# Patient Record
Sex: Male | Born: 1960
Health system: Southern US, Community
[De-identification: ages and names within clinical notes are randomized; demographics above are authoritative.]

## PROBLEM LIST (undated history)

## (undated) DIAGNOSIS — K219 Gastro-esophageal reflux disease without esophagitis: Secondary | ICD-10-CM

## (undated) DIAGNOSIS — M7542 Impingement syndrome of left shoulder: Secondary | ICD-10-CM

## (undated) DIAGNOSIS — M7522 Bicipital tendinitis, left shoulder: Secondary | ICD-10-CM

## (undated) DIAGNOSIS — Z98811 Dental restoration status: Secondary | ICD-10-CM

## (undated) DIAGNOSIS — M24112 Other articular cartilage disorders, left shoulder: Secondary | ICD-10-CM

---

## 1985-06-02 HISTORY — PX: WISDOM TOOTH EXTRACTION: SHX21

## 1998-01-20 ENCOUNTER — Emergency Department (HOSPITAL_COMMUNITY): Admission: EM | Admit: 1998-01-20 | Discharge: 1998-01-20 | Payer: Self-pay | Admitting: Emergency Medicine

## 2004-07-01 ENCOUNTER — Ambulatory Visit: Payer: Self-pay | Admitting: Family Medicine

## 2004-07-18 ENCOUNTER — Ambulatory Visit: Payer: Self-pay | Admitting: Family Medicine

## 2006-03-09 ENCOUNTER — Ambulatory Visit: Payer: Self-pay | Admitting: Family Medicine

## 2006-06-01 ENCOUNTER — Ambulatory Visit: Payer: Self-pay | Admitting: Family Medicine

## 2008-02-28 ENCOUNTER — Ambulatory Visit: Payer: Self-pay | Admitting: Family Medicine

## 2008-05-12 ENCOUNTER — Encounter: Admission: RE | Admit: 2008-05-12 | Discharge: 2008-05-12 | Payer: Self-pay | Admitting: Family Medicine

## 2008-05-12 ENCOUNTER — Ambulatory Visit: Payer: Self-pay | Admitting: Family Medicine

## 2008-05-12 ENCOUNTER — Encounter (INDEPENDENT_AMBULATORY_CARE_PROVIDER_SITE_OTHER): Payer: Self-pay | Admitting: Internal Medicine

## 2008-05-12 DIAGNOSIS — R209 Unspecified disturbances of skin sensation: Secondary | ICD-10-CM | POA: Insufficient documentation

## 2008-05-12 LAB — CONVERTED CEMR LAB
Bilirubin Urine: NEGATIVE
Blood in Urine, dipstick: NEGATIVE
Glucose, Urine, Semiquant: NEGATIVE
Protein, U semiquant: NEGATIVE
Specific Gravity, Urine: 1.015
Urobilinogen, UA: 0.2
WBC Urine, dipstick: NEGATIVE

## 2008-05-16 ENCOUNTER — Encounter (INDEPENDENT_AMBULATORY_CARE_PROVIDER_SITE_OTHER): Payer: Self-pay | Admitting: Internal Medicine

## 2008-05-16 LAB — CONVERTED CEMR LAB
ALT: 21 units/L (ref 0–53)
Basophils Absolute: 0 10*3/uL (ref 0.0–0.1)
Basophils Relative: 0 % (ref 0.0–3.0)
Bilirubin, Direct: 0.1 mg/dL (ref 0.0–0.3)
CO2: 29 meq/L (ref 19–32)
Calcium: 9.4 mg/dL (ref 8.4–10.5)
Cholesterol: 209 mg/dL (ref 0–200)
Creatinine, Ser: 1 mg/dL (ref 0.4–1.5)
Direct LDL: 136.5 mg/dL
Eosinophils Absolute: 0.3 10*3/uL (ref 0.0–0.7)
GFR calc Af Amer: 103 mL/min
GFR calc non Af Amer: 85 mL/min
HCT: 41.8 % (ref 39.0–52.0)
Hemoglobin: 14.9 g/dL (ref 13.0–17.0)
MCHC: 35.7 g/dL (ref 30.0–36.0)
MCV: 92.8 fL (ref 78.0–100.0)
Monocytes Absolute: 0.5 10*3/uL (ref 0.1–1.0)
Neutro Abs: 2.5 10*3/uL (ref 1.4–7.7)
PSA: 0.73 ng/mL (ref 0.10–4.00)
RBC: 4.5 M/uL (ref 4.22–5.81)
RDW: 12 % (ref 11.5–14.6)
Sodium: 139 meq/L (ref 135–145)
TSH: 1.29 microintl units/mL (ref 0.35–5.50)
Total Bilirubin: 1.1 mg/dL (ref 0.3–1.2)
VLDL: 11 mg/dL (ref 0–40)

## 2008-05-23 ENCOUNTER — Encounter (INDEPENDENT_AMBULATORY_CARE_PROVIDER_SITE_OTHER): Payer: Self-pay | Admitting: Internal Medicine

## 2009-08-13 ENCOUNTER — Ambulatory Visit: Payer: Self-pay | Admitting: Family Medicine

## 2010-05-06 ENCOUNTER — Ambulatory Visit: Payer: Self-pay | Admitting: Family Medicine

## 2010-05-06 DIAGNOSIS — L909 Atrophic disorder of skin, unspecified: Secondary | ICD-10-CM | POA: Insufficient documentation

## 2010-05-06 DIAGNOSIS — L919 Hypertrophic disorder of the skin, unspecified: Secondary | ICD-10-CM

## 2010-05-07 LAB — CONVERTED CEMR LAB
CO2: 29 meq/L (ref 19–32)
Chloride: 102 meq/L (ref 96–112)
Creatinine, Ser: 1 mg/dL (ref 0.4–1.5)
Glucose, Bld: 101 mg/dL — ABNORMAL HIGH (ref 70–99)
Total CHOL/HDL Ratio: 4
Triglycerides: 55 mg/dL (ref 0.0–149.0)

## 2010-07-02 NOTE — Assessment & Plan Note (Signed)
Summary: COUGH,CONGESTION/CLE   Vital Signs:  Patient profile:   50 year old male Height:      71 inches Weight:      192.8 pounds BMI:     26.99 Temp:     98.0 degrees F oral Pulse rate:   76 / minute Pulse rhythm:   regular BP sitting:   110 / 70  (left arm) Cuff size:   regular  Vitals Entered By: Benny Lennert CMA Duncan Dull) (August 13, 2009 10:49 AM)  History of Present Illness: Chief complaint cough and congestion for 62 days  50 year old male:  No fever  Has been trying some stuff from [facility], over the counter coughing a lot at night    Acute Visit History:      The patient complains of cough, headache, musculoskeletal symptoms, nasal discharge, and sore throat.  These symptoms began 4 days ago.  He denies chest pain, fever, nausea, rash, and sinus problems.        The cough interferes with his sleep.  There is no history of wheezing, shortness of breath, respiratory retractions, tachypnea, cyanosis, or interference with oral intake associated with his cough.        'Cold' or URI symptoms have been present with the sore throat.  There is no history of dysphagia, drooling, or recent exposure to strep.        Urine output has been normal.  He is tolerating clear liquids.        Allergies (verified): No Known Drug Allergies  Past History:  Past medical, surgical, family and social histories (including risk factors) reviewed, and no changes noted (except as noted below).  Past Surgical History: Reviewed history from 05/12/2008 and no changes required. wisdom teeth--age 44  Family History: Reviewed history from 05/12/2008 and no changes required. Father: died at age 39--seizure Mother: 78--L&W Siblings: 1 br--L&W               5 sis--L&W  DM-  0 MI- 0 CVA- 0 Prostate Cancer- 0 Breast Cancer- Ovarian Cancer- Uterine Cancer- Colon Cancer- 0 Drug/ ETOH Abuse- 0 Depression-   Social History: Reviewed history from 05/12/2008 and no changes required. Marital  Status: Married Children: 3--1 in college, 2 at home Occupation: septic system--owner  Review of Systems       REVIEW OF SYSTEMS GEN: Acute illness details above. CV: No chest pain or SOB GI: No noted N or V Otherwise, pertinent positives and negatives are noted in the HPI.   Physical Exam  Additional Exam:  GEN: WDWN, NAD; alert,appropriate and cooperative throughout exam HEENT: Normocephalic and atraumatic. Throat clear, w/o exudate, no LAD, R TM clear, L TM - good landmarks, No fluid present. rhinnorhea.  Left frontal and maxillary sinuses: NT Right frontal and maxillary sinuses: NT NECK: No ant or post LAD CV: RRR, No M/G/R PULM: no resp distress, no accessory muscles.  No retractions. no w/c/r ABD: S,NT,ND,+BS, No HSM EXTR: no c/c/e PSYCH: full affect, pleasant, conversant    Impression & Recommendations:  Problem # 1:  URI (ICD-465.9) Assessment New prob viral syndrome with pulmonary symptoms. patient highly concerned about bacterial infection. Instructed to keep Zpak for 4-5 more days, if worsening, febrile, OK to fill. If getting better, viral  His updated medication list for this problem includes:    Tessalon 200 Mg Caps (Benzonatate) .Marland Kitchen... Take one capsule by mouth three times a day as needed for cough    Hydrocodone-homatropine 5-1.5 Mg/106ml Syrp (Hydrocodone-homatropine) .Marland KitchenMarland KitchenMarland KitchenMarland Kitchen 1  by mouth at bedtime as needed cough  Problem # 2:  COUGH (ICD-786.2) Assessment: New  Complete Medication List: 1)  Tessalon 200 Mg Caps (Benzonatate) .... Take one capsule by mouth three times a day as needed for cough 2)  Hydrocodone-homatropine 5-1.5 Mg/65ml Syrp (Hydrocodone-homatropine) .Marland Kitchen.. 1 by mouth at bedtime as needed cough 3)  Azithromycin 250 Mg Tabs (Azithromycin) .... 2 by  mouth today and then 1 daily for 4 days Prescriptions: AZITHROMYCIN 250 MG  TABS (AZITHROMYCIN) 2 by  mouth today and then 1 daily for 4 days  #6 x 0   Entered and Authorized by:   Hannah Beat MD    Signed by:   Hannah Beat MD on 08/13/2009   Method used:   Print then Give to Patient   RxID:   [id number] HYDROCODONE-HOMATROPINE 5-1.5 MG/5ML SYRP (HYDROCODONE-HOMATROPINE) 1 by mouth at bedtime as needed cough  #240 mL x 0   Entered and Authorized by:   Hannah Beat MD   Signed by:   Hannah Beat MD on 08/13/2009   Method used:   Print then Give to Patient   RxID:   [id number] TESSALON 200 MG CAPS (BENZONATATE) Take one capsule by mouth three times a day as needed for cough  #40 x 0   Entered and Authorized by:   Hannah Beat MD   Signed by:   Hannah Beat MD on 08/13/2009   Method used:   Print then Give to Patient   RxID:   [id number]   Prior Medications (reviewed today): None Current Allergies (reviewed today): No known allergies

## 2010-07-02 NOTE — Assessment & Plan Note (Signed)
Summary: cpx/establish w/ new dr. Annye Asa   Vital Signs:  Patient profile:   50 year old male Height:      71 inches Weight:      199.75 pounds BMI:     27.96 Temp:     98.5 degrees F oral Pulse rate:   54 / minute Pulse rhythm:   regular BP sitting:   110 / 84  (left arm) Cuff size:   large  Vitals Entered By: Selena Batten Dance CMA (AAMA) (May 06, 2010 8:25 AM) CC: CPx  Vision Screening:Left eye w/o correction: 20 / 30 Right Eye w/o correction: 20 / 25 Both eyes w/o correction:  20/ 25  Color vision testing: normal      Vision Entered By: Selena Batten Dance CMA (AAMA) (May 06, 2010 9:27 AM)  Hearing Screen 25db HL: Left  500 hz: 25db 1000 hz: 25db 2000 hz: 25db 4000 hz: 25db Right  500 hz: 25db 1000 hz: 25db 2000 hz: 25db 4000 hz: 25db    History of Present Illness: CC: CPE  requests flu shot.  No concerns today.  would like skin spot on L abd checked - irritating and catches on clothes  not due for prostate/colon screen.  Strong stream, no nocturia, no blood in stool or urine, no changes to BMs. tetanus - 2009  + chewing tobacco 1 can/day.  will quit at age 43yo.  Preventive Screening-Counseling & Management  Alcohol-Tobacco     Smoking Status: current     Cans of tobacco/week: 1  Current Medications (verified): 1)  None  Allergies (verified): No Known Drug Allergies  Past History:  Past Medical History: none  Family History: Father: died at age 62--seizure Mother: 78--L&W Siblings: 1 br--L&W               5 sis--L&W  No DM, CAD/MI, CVA, no CA  Social History: No smoking, occ EtOH, no rec drugs + chewing tobacco Caffeine: 1 cup coffee, 1 drink a day Marital Status: Married Children: 3--1 in college, 2 at home, 3 dogs, 1 cat Occupation: septic system--owner  Review of Systems  The patient denies anorexia, fever, weight loss, weight gain, vision loss, decreased hearing, hoarseness, chest pain, syncope, dyspnea on exertion, peripheral  edema, prolonged cough, headaches, hemoptysis, abdominal pain, melena, hematochezia, severe indigestion/heartburn, hematuria, incontinence, difficulty walking, depression, and testicular masses.    Physical Exam  General:  Well-developed,well-nourished,in no acute distress; alert,appropriate and cooperative throughout examination Head:  Normocephalic and atraumatic without obvious abnormalities. No apparent alopecia or balding. Eyes:  PERRLA, EOMI, no injection Ears:  TMs clear bilaterally Nose:  nares clear Mouth:  no pharyngeal erythema Neck:  no LAD Lungs:  CTAB, no c/w Heart:  normal S1, S2, no m/r/g Abdomen:  soft, NTND, NABS, no HSM, no masses appreciated Msk:  No deformity or scoliosis noted of thoracic or lumbar spine.   Pulses:  2+ rad pulses Extremities:  no c/c/e Neurologic:  CN grossly intact, station and gait intact Skin:  L skin tag, irritated at base, over inferior rib cage Psych:  Cognition and judgment appear intact. Alert and cooperative with normal attention span and concentration. No apparent delusions, illusions, hallucinations   Impression & Recommendations:  Problem # 1:  OTH GENERAL MEDICAL EXAMINATION ADMIN PURPOSES (ICD-V70.3) Reviewed preventive care protocols, scheduled due services, and updated immunizations.  rec stop dipping.  flu shot todya.  UTD tetanus.  not due for colon, prostate.  Orders: TLB-Lipid Panel (80061-LIPID) TLB-BMP (Basic Metabolic Panel-BMET) (80048-METABOL)  Problem # 2:  SPECIAL SCREENING MALIGNANT NEOPLASM OF PROSTATE (ICD-V76.44) 2009 PSA WNL, will wait til age 55 to readress  Problem # 3:  ACROCHORDON (ICD-701.9) irritated, removed x 1 Orders: Removal of Skin Tags up to 15 Lesions (11200)  Other Orders: Flu Vaccine 74yrs + (64403) Admin 1st Vaccine (47425)  Patient Instructions: 1)  Return in 1 year (at age 37yo) for next CPE. 2)  Skin tag removed today.   3)  Flu shot today. 4)  Blood work today. 5)  Good to see you  today, call clinic with questions.   Orders Added: 1)  TLB-Lipid Panel [80061-LIPID] 2)  TLB-BMP (Basic Metabolic Panel-BMET) [80048-METABOL] 3)  Est. Patient 40-64 years [99396] 4)  Removal of Skin Tags up to 15 Lesions [11200] 5)  Flu Vaccine 21yrs + [90658] 6)  Admin 1st Vaccine [95638]   Immunizations Administered:  Influenza Vaccine # 1:    Vaccine Type: Fluvax 3+    Site: right deltoid    Mfr: GlaxoSmithKline    Dose: 0.5 ml    Route: IM    Given by: Selena Batten Dance CMA (AAMA)    Exp. Date: 11/30/2010    Lot #: [id number]    VIS given: 12/25/09 version given May 06, 2010.  Flu Vaccine Consent Questions:    Do you have a history of severe allergic reactions to this vaccine? no    Any prior history of allergic reactions to egg and/or gelatin? no    Do you have a sensitivity to the preservative Thimersol? no    Do you have a past history of Guillan-Barre Syndrome? no    Do you currently have an acute febrile illness? no    Have you ever had a severe reaction to latex? no    Vaccine information given and explained to patient? yes   Immunizations Administered:  Influenza Vaccine # 1:    Vaccine Type: Fluvax 3+    Site: right deltoid    Mfr: GlaxoSmithKline    Dose: 0.5 ml    Route: IM    Given by: Selena Batten Dance CMA (AAMA)    Exp. Date: 11/30/2010    Lot #: [id number]    VIS given: 12/25/09 version given May 06, 2010.  Current Allergies (reviewed today): No known allergies     Procedure Note Last Tetanus: Tdap (05/12/2008)  Skin Tag Removal: The patient complains of redness, irritation, and changing lesion but denies foreign body and fever. Indication: inflamed lesion Consent signed: yes  Procedure # 1: skin tag removal    Region: anterior    Location: abdomen-upper-left    # lesions removed: 1    Instrument used: scissors    Anesthesia: 1% lidocaine w/epinephrine  Cleaned and prepped with: alcohol Wound dressing: neosporin and  bandaid   Prevention & Chronic Care Immunizations   Influenza vaccine: Fluvax 3+  (05/06/2010)    Tetanus booster: 05/12/2008: Tdap   Tetanus booster due: 05/12/2018    Pneumococcal vaccine: Not documented  Other Screening   Smoking status: current  (05/06/2010)  Lipids   Total Cholesterol: 209  (05/12/2008)   LDL: DEL  (05/12/2008)   LDL Direct: 136.5  (05/12/2008)   HDL: 57.0  (05/12/2008)   Triglycerides: 53  (05/12/2008)

## 2010-07-02 NOTE — Miscellaneous (Signed)
Summary: Procedure Concent  Procedure Concent   Imported By: Lester [place] 05/09/2010 12:15:52  _____________________________________________________________________  External Attachment:    Type:   Image     Comment:   External Document

## 2010-07-04 NOTE — Letter (Signed)
Summary: CDL Form/NCDMV  CDL Form/NCDMV   Imported By: Lanelle Bal 05/28/2010 15:34:00  _____________________________________________________________________  External Attachment:    Type:   Image     Comment:   External Document

## 2011-02-17 ENCOUNTER — Encounter: Payer: Self-pay | Admitting: Family Medicine

## 2011-02-18 ENCOUNTER — Encounter: Payer: Self-pay | Admitting: Family Medicine

## 2011-02-18 ENCOUNTER — Ambulatory Visit (INDEPENDENT_AMBULATORY_CARE_PROVIDER_SITE_OTHER): Payer: Self-pay | Admitting: Family Medicine

## 2011-02-18 VITALS — BP 122/78 | HR 64 | Temp 97.7°F | Ht 70.0 in | Wt 205.5 lb

## 2011-02-18 DIAGNOSIS — R42 Dizziness and giddiness: Secondary | ICD-10-CM

## 2011-02-18 DIAGNOSIS — Z23 Encounter for immunization: Secondary | ICD-10-CM

## 2011-02-18 LAB — BASIC METABOLIC PANEL
BUN: 23 mg/dL (ref 6–23)
Chloride: 103 mEq/L (ref 96–112)
Creatinine, Ser: 1 mg/dL (ref 0.4–1.5)
GFR: 83.12 mL/min (ref >60.00)
Glucose, Bld: 96 mg/dL (ref 70–99)
Potassium: 4.4 mEq/L (ref 3.5–5.1)

## 2011-02-18 LAB — CBC WITH DIFFERENTIAL/PLATELET
Basophils Relative: 0.2 % (ref 0.0–3.0)
Eosinophils Absolute: 0.2 10*3/uL (ref 0.0–0.7)
Eosinophils Relative: 4.6 % (ref 0.0–5.0)
HCT: 46 % (ref 39.0–52.0)
Hemoglobin: 15.7 g/dL (ref 13.0–17.0)
Lymphs Abs: 1.3 10*3/uL (ref 0.7–4.0)
MCHC: 34.2 g/dL (ref 30.0–36.0)
MCV: 95.5 fl (ref 78.0–100.0)
Monocytes Absolute: 0.5 10*3/uL (ref 0.1–1.0)
Neutro Abs: 3 10*3/uL (ref 1.4–7.7)
Neutrophils Relative %: 59.7 % (ref 43.0–77.0)
RBC: 4.82 Mil/uL (ref 4.22–5.81)
WBC: 5 10*3/uL (ref 4.5–10.5)

## 2011-02-18 LAB — HEPATIC FUNCTION PANEL
ALT: 30 U/L (ref 0–53)
AST: 24 U/L (ref 0–37)

## 2011-02-18 NOTE — Patient Instructions (Signed)
See printed notes.

## 2011-02-18 NOTE — Progress Notes (Signed)
  Subjective:    Patient ID: Christopher Ballard, male    DOB: 1960/11/01, 50 y.o.   MRN: [id number]  HPI CC: HA, dizziness  1 1/2 wk h/o lightheadedness in AM, when awakens.  Mainly orthostatic sxs.  Also with pressure in temples (not HA), subjective temperature as well.  Dizziness described as "unstable on feet".  Denies RN, significant congestion, sneezing, cough, abd pain, n/v/d, muffled hearing, tinnitus, chest pain or tightness, SOB or vertigo sxs.  No new rash, no palpitation symptoms.  Recently had CPE, cleared for 2 years.  He hasn't tried anything so far either.  Family and pt very healthy o/w.  Also has had several what he thinks are chigger bites, for last 2 wks mainly left leg.  Using OTC anti chigger medicine that dries bites out.  Review of Systems Per HPI    Objective:   Physical Exam  Nursing note and vitals reviewed. Constitutional: He is oriented to person, place, and time. He appears well-developed and well-nourished. No distress.  HENT:  Head: Normocephalic and atraumatic.  Right Ear: Hearing and external ear normal.  Left Ear: Hearing and external ear normal.  Nose: Mucosal edema present. No rhinorrhea. Right sinus exhibits no maxillary sinus tenderness and no frontal sinus tenderness. Left sinus exhibits no maxillary sinus tenderness and no frontal sinus tenderness.  Mouth/Throat: Uvula is midline, oropharynx is clear and moist and mucous membranes are normal. No oropharyngeal exudate, posterior oropharyngeal edema, posterior oropharyngeal erythema or tonsillar abscesses.       Cerumen bilaterally, but TMs clear. Turbinates swollen bilaterally  Eyes: Conjunctivae and EOM are normal. Pupils are equal, round, and reactive to light. No scleral icterus.  Neck: Normal range of motion. Neck supple. Carotid bruit is not present.  Cardiovascular: Normal rate, regular rhythm, normal heart sounds and intact distal pulses.   No murmur heard. Pulmonary/Chest: Effort normal  and breath sounds normal. No respiratory distress. He has no wheezes. He has no rales.  Musculoskeletal: Normal range of motion.  Lymphadenopathy:    He has no cervical adenopathy.  Neurological: He is alert and oriented to person, place, and time. He has normal strength. He displays normal reflexes. No cranial nerve deficit or sensory deficit. He exhibits normal muscle tone. He displays a negative Romberg sign. Coordination and gait normal.  Reflex Scores:      Bicep reflexes are 2+ on the right side and 2+ on the left side.      Patellar reflexes are 2+ on the right side and 2+ on the left side.      Achilles reflexes are 2+ on the right side and 2+ on the left side.      Normal FTN, normal HTS, no dysdiadokokinesia, neg dix hallpike.  Skin: Skin is warm and dry.       Papular excoriated lesions left leg from ankle to knee, pruritic.  Psychiatric: He has a normal mood and affect.          Assessment & Plan:

## 2011-02-18 NOTE — Assessment & Plan Note (Signed)
Doubt inner ear pathology. Normal neurologic exam today.  Normal cardiac exam today. Some nasal turbinate swelling - ?sinus congestion causing head pressure and dizziness.  Start nasal saline and INS (provided with veramyst sample). Check blood work for reversible causes if dizziness. If worsening or not improving as expected, return to see me.

## 2012-04-23 ENCOUNTER — Encounter: Payer: BC Managed Care – PPO | Admitting: Family Medicine

## 2012-05-07 ENCOUNTER — Encounter: Payer: BC Managed Care – PPO | Admitting: Family Medicine

## 2012-05-19 ENCOUNTER — Ambulatory Visit (INDEPENDENT_AMBULATORY_CARE_PROVIDER_SITE_OTHER): Payer: BC Managed Care – PPO | Admitting: Family Medicine

## 2012-05-19 ENCOUNTER — Encounter: Payer: Self-pay | Admitting: Family Medicine

## 2012-05-19 ENCOUNTER — Encounter: Payer: Self-pay | Admitting: Internal Medicine

## 2012-05-19 VITALS — BP 130/82 | HR 60 | Temp 98.4°F | Ht 70.0 in | Wt 206.8 lb

## 2012-05-19 DIAGNOSIS — Z1211 Encounter for screening for malignant neoplasm of colon: Secondary | ICD-10-CM

## 2012-05-19 DIAGNOSIS — Z0000 Encounter for general adult medical examination without abnormal findings: Secondary | ICD-10-CM | POA: Insufficient documentation

## 2012-05-19 DIAGNOSIS — Z23 Encounter for immunization: Secondary | ICD-10-CM

## 2012-05-19 DIAGNOSIS — Z0279 Encounter for issue of other medical certificate: Secondary | ICD-10-CM

## 2012-05-19 DIAGNOSIS — N529 Male erectile dysfunction, unspecified: Secondary | ICD-10-CM | POA: Insufficient documentation

## 2012-05-19 DIAGNOSIS — Z0001 Encounter for general adult medical examination with abnormal findings: Secondary | ICD-10-CM | POA: Insufficient documentation

## 2012-05-19 DIAGNOSIS — Z125 Encounter for screening for malignant neoplasm of prostate: Secondary | ICD-10-CM

## 2012-05-19 LAB — BASIC METABOLIC PANEL
BUN: 19 mg/dL (ref 6–23)
Chloride: 104 mEq/L (ref 96–112)
Creatinine, Ser: 0.9 mg/dL (ref 0.4–1.5)
GFR: 94.47 mL/min (ref >60.00)
Potassium: 4.9 mEq/L (ref 3.5–5.1)

## 2012-05-19 LAB — LIPID PANEL
LDL Cholesterol: 118 mg/dL — ABNORMAL HIGH (ref 0–99)
Total CHOL/HDL Ratio: 4
VLDL: 13.2 mg/dL (ref 0.0–40.0)

## 2012-05-19 LAB — POCT URINALYSIS DIPSTICK
Blood, UA: NEGATIVE
Ketones, UA: NEGATIVE
Protein, UA: NEGATIVE
Spec Grav, UA: 1.01
Urobilinogen, UA: 0.2

## 2012-05-19 LAB — PSA: PSA: 1.06 ng/mL (ref 0.10–4.00)

## 2012-05-19 MED ORDER — TADALAFIL 10 MG PO TABS: 10.0000 mg | ORAL_TABLET | ORAL | Status: DC | PRN

## 2012-05-19 MED ORDER — TADALAFIL 20 MG PO TABS: 10.0000 mg | ORAL_TABLET | ORAL | Status: DC | PRN

## 2012-05-19 NOTE — Assessment & Plan Note (Signed)
Framingham risk = 5%.  Denies anginal or chest discomfort sxs.  Trial of cialis.  Discussed side effects to watch for. coupon provided today.

## 2012-05-19 NOTE — Progress Notes (Signed)
  Subjective:    Patient ID: Christopher Ballard, male    DOB: [birth year], 51 y.o.   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  Some ED - trouble maintaining erection.  Has tried viagra in past, interested in cialis  Preventative: Flu shot today Tetanus 2009 Colon cancer screening - discussed, would like colonsocopy Prostate cancer screening - discussed, will screen this year.  Review of Systems  Constitutional: Negative for fever, chills, activity change, appetite change, fatigue and unexpected weight change.  HENT: Negative for hearing loss and neck pain.   Eyes: Negative for visual disturbance.  Respiratory: Negative for cough, chest tightness, shortness of breath and wheezing.   Cardiovascular: Negative for chest pain, palpitations and leg swelling.  Gastrointestinal: Negative for nausea, vomiting, abdominal pain, diarrhea, constipation, blood in stool and abdominal distention.  Genitourinary: Negative for hematuria and difficulty urinating.  Musculoskeletal: Negative for myalgias and arthralgias.  Skin: Negative for rash.  Neurological: Negative for dizziness, seizures, syncope and headaches.  Hematological: Does not bruise/bleed easily.  Psychiatric/Behavioral: Negative for dysphoric mood. The patient is not nervous/anxious.        Objective:   Physical Exam  Nursing note and vitals reviewed. Constitutional: He is oriented to person, place, and time. He appears well-developed and well-nourished. No distress.  HENT:  Head: Normocephalic and atraumatic.  Right Ear: Hearing, tympanic membrane, external ear and ear canal normal.  Left Ear: Hearing, tympanic membrane, external ear and ear canal normal.  Nose: Nose normal.  Mouth/Throat: Oropharynx is clear and moist. No oropharyngeal exudate.       Cerumen in ears bilaterally - discussed dilute H2O2  Eyes: Conjunctivae normal and EOM are normal. Pupils are equal, round, and reactive to light.  No scleral icterus.  Neck: Normal range of motion. Neck supple.  Cardiovascular: Normal rate, regular rhythm, normal heart sounds and intact distal pulses.   No murmur heard. Pulses:      Radial pulses are 2+ on the right side, and 2+ on the left side.  Pulmonary/Chest: Effort normal and breath sounds normal. No respiratory distress. He has no wheezes. He has no rales.  Abdominal: Soft. Bowel sounds are normal. He exhibits no distension and no mass. There is no tenderness. There is no rebound and no guarding.  Genitourinary: Rectum normal and prostate normal. Rectal exam shows no external hemorrhoid, no internal hemorrhoid, no fissure, no mass, no tenderness and anal tone normal. Prostate is not enlarged (20gm) and not tender.  Musculoskeletal: Normal range of motion. He exhibits no edema.  Lymphadenopathy:    He has no cervical adenopathy.  Neurological: He is alert and oriented to person, place, and time.       CN grossly intact, station and gait intact  Skin: Skin is warm and dry. No rash noted.  Psychiatric: He has a normal mood and affect. His behavior is normal. Judgment and thought content normal.       Assessment & Plan:

## 2012-05-19 NOTE — Assessment & Plan Note (Signed)
Preventative protocols reviewed and updated unless pt declined. Discussed healthy diet and lifestyle. 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

## 2012-05-19 NOTE — Patient Instructions (Addendum)
Flu shot today Pass by Marion's office to schedule screening colonoscopy. Blood work today. Trial of cialis 10-20mg  as needed - sent into pharmacy today.

## 2012-05-20 ENCOUNTER — Encounter: Payer: Self-pay | Admitting: *Deleted

## 2012-07-09 ENCOUNTER — Ambulatory Visit (AMBULATORY_SURGERY_CENTER): Payer: BC Managed Care – PPO

## 2012-07-09 ENCOUNTER — Encounter: Payer: Self-pay | Admitting: Internal Medicine

## 2012-07-09 VITALS — Ht 70.0 in | Wt 203.8 lb

## 2012-07-09 DIAGNOSIS — Z1211 Encounter for screening for malignant neoplasm of colon: Secondary | ICD-10-CM

## 2012-07-09 MED ORDER — NA SULFATE-K SULFATE-MG SULF 17.5-3.13-1.6 GM/177ML PO SOLN: 1.0000 | Freq: Once | ORAL | Status: DC

## 2012-07-23 ENCOUNTER — Encounter: Payer: Self-pay | Admitting: Internal Medicine

## 2012-07-23 ENCOUNTER — Ambulatory Visit (AMBULATORY_SURGERY_CENTER): Payer: BC Managed Care – PPO | Admitting: Internal Medicine

## 2012-07-23 VITALS — BP 100/67 | HR 53 | Temp 96.5°F | Resp 15 | Ht 70.0 in | Wt 203.0 lb

## 2012-07-23 DIAGNOSIS — Z1211 Encounter for screening for malignant neoplasm of colon: Secondary | ICD-10-CM

## 2012-07-23 DIAGNOSIS — K648 Other hemorrhoids: Secondary | ICD-10-CM

## 2012-07-23 HISTORY — PX: COLONOSCOPY WITH PROPOFOL: SHX5780

## 2012-07-23 MED ORDER — SODIUM CHLORIDE 0.9 % IV SOLN: 500.0000 mL | INTRAVENOUS | Status: DC

## 2012-07-23 NOTE — Progress Notes (Signed)
Patient did not experience any of the following events: a burn prior to discharge; a fall within the facility; wrong site/side/patient/procedure/implant event; or a hospital transfer or hospital admission upon discharge from the facility. (G8907) Patient did not have preoperative order for IV antibiotic SSI prophylaxis. (G8918)  

## 2012-07-23 NOTE — Op Note (Signed)
[facility] [address] [place], [address]   COLONOSCOPY PROCEDURE REPORT  [name], Ballard  MR#: [id number] BIRTHDATE: 04-20-1961 , 51  yrs. old GENDER: Male ENDOSCOPIST: Iva Boop, MD, [facility] REFERRED XB:JYNWGN Sharen Hones, M.D. PROCEDURE DATE:  07/23/2012 PROCEDURE:   Colonoscopy, screening ASA CLASS:   Class I INDICATIONS:average risk screening. MEDICATIONS: propofol (Diprivan) 200mg  IV, MAC sedation, administered by CRNA, and These medications were titrated to Ballard response per physician's verbal order  DESCRIPTION OF PROCEDURE:   After Christopher risks benefits and alternatives of Christopher procedure were thoroughly explained, informed consent was obtained.  A digital rectal exam revealed no abnormalities of Christopher rectum, A digital rectal exam revealed Christopher prostate was not enlarged, and A digital rectal exam revealed no prostatic nodules.   Christopher LB [id number]  endoscope was introduced through Christopher anus and advanced to Christopher cecum, which was identified by both Christopher appendix and ileocecal valve. No adverse events experienced.   Christopher quality of Christopher prep was Suprep excellent Christopher instrument was then slowly withdrawn as Christopher colon was fully examined.      COLON FINDINGS: Small internal hemorrhoids were found.   Christopher colon mucosa was otherwise normal.   A right colon retroflexion was performed.  Retroflexed views revealed internal hemorrhoids. Christopher time to cecum=2 minutes 36 seconds.  Withdrawal time=7 minutes 58 seconds.  Christopher scope was withdrawn and Christopher procedure completed. COMPLICATIONS: There were no complications.  ENDOSCOPIC IMPRESSION: 1.   Small internal hemorrhoids 2.   Christopher colon mucosa was otherwise normal with excellent prep  RECOMMENDATIONS: Repeat colonoscopy 10 years.   eSigned:  Iva Boop, MD, [facility] 07/23/2012 9:34 AM   cc: Eustaquio Boyden MD and Christopher Ballard

## 2012-07-23 NOTE — Patient Instructions (Addendum)
No polyps seen on exam today. Prep was great! You do have small internal hemorrhoids which are common and not usually a problem.  Next routine colonoscopy in about 10 years (2024).  Thank you for choosing me and [facility].  Iva Boop, MD, [facility]  Discharge instructions given with verbal understanding. Handout on hemorrhoids. Resume previous medications. YOU HAD AN ENDOSCOPIC PROCEDURE TODAY AT THE [facility]: Refer to the procedure report that was given to you for any specific questions about what was found during the examination.  If the procedure report does not answer your questions, please call your gastroenterologist to clarify.  If you requested that your care partner not be given the details of your procedure findings, then the procedure report has been included in a sealed envelope for you to review at your convenience later.  YOU SHOULD EXPECT: Some feelings of bloating in the abdomen. Passage of more gas than usual.  Walking can help get rid of the air that was put into your GI tract during the procedure and reduce the bloating. If you had a lower endoscopy (such as a colonoscopy or flexible sigmoidoscopy) you may notice spotting of blood in your stool or on the toilet paper. If you underwent a bowel prep for your procedure, then you may not have a normal bowel movement for a few days.  DIET: Your first meal following the procedure should be a light meal and then it is ok to progress to your normal diet.  A half-sandwich or bowl of soup is an example of a good first meal.  Heavy or fried foods are harder to digest and may make you feel nauseous or bloated.  Likewise meals heavy in dairy and vegetables can cause extra gas to form and this can also increase the bloating.  Drink plenty of fluids but you should avoid alcoholic beverages for 24 hours.  ACTIVITY: Your care partner should take you home directly after the procedure.  You should plan to take it  easy, moving slowly for the rest of the day.  You can resume normal activity the day after the procedure however you should NOT DRIVE or use heavy machinery for 24 hours (because of the sedation medicines used during the test).    SYMPTOMS TO REPORT IMMEDIATELY: A gastroenterologist can be reached at any hour.  During normal business hours, 8:30 AM to 5:00 PM Monday through Friday, call [phone].  After hours and on weekends, please call the GI answering service at [phone] who will take a message and have the physician on call contact you.   Following lower endoscopy (colonoscopy or flexible sigmoidoscopy):  Excessive amounts of blood in the stool  Significant tenderness or worsening of abdominal pains  Swelling of the abdomen that is new, acute  Fever of 100F or higher  FOLLOW UP: If any biopsies were taken you will be contacted by phone or by letter within the next 1-3 weeks.  Call your gastroenterologist if you have not heard about the biopsies in 3 weeks.  Our staff will call the home number listed on your records the next business day following your procedure to check on you and address any questions or concerns that you may have at that time regarding the information given to you following your procedure. This is a courtesy call and so if there is no answer at the home number and we have not heard from you through the emergency physician on call, we will assume that  you have returned to your regular daily activities without incident.  SIGNATURES/CONFIDENTIALITY: You and/or your care partner have signed paperwork which will be entered into your electronic medical record.  These signatures attest to the fact that that the information above on your After Visit Summary has been reviewed and is understood.  Full responsibility of the confidentiality of this discharge information lies with you and/or your care-partner.

## 2012-07-26 ENCOUNTER — Telehealth: Payer: Self-pay | Admitting: *Deleted

## 2012-07-26 NOTE — Telephone Encounter (Signed)
  Follow up Call-  Call back number 07/23/2012  Post procedure Call Back phone  # [phone]  Permission to leave phone message Yes     Patient questions:  Do you have a fever, pain , or abdominal swelling? no Pain Score  0 *  Have you tolerated food without any problems? yes  Have you been able to return to your normal activities? yes  Do you have any questions about your discharge instructions: Diet   no Medications  no Follow up visit  no  Do you have questions or concerns about your Care? no  Actions: * If pain score is 4 or above: No action needed, pain <4.

## 2012-08-09 ENCOUNTER — Encounter: Payer: Self-pay | Admitting: Family Medicine

## 2014-04-20 ENCOUNTER — Ambulatory Visit (INDEPENDENT_AMBULATORY_CARE_PROVIDER_SITE_OTHER): Payer: BC Managed Care – PPO | Admitting: Family Medicine

## 2014-04-20 ENCOUNTER — Encounter: Payer: Self-pay | Admitting: Family Medicine

## 2014-04-20 VITALS — BP 110/68 | HR 68 | Temp 98.3°F | Wt 212.8 lb

## 2014-04-20 DIAGNOSIS — J069 Acute upper respiratory infection, unspecified: Secondary | ICD-10-CM

## 2014-04-20 DIAGNOSIS — Z23 Encounter for immunization: Secondary | ICD-10-CM

## 2014-04-20 MED ORDER — AMOXICILLIN-POT CLAVULANATE 875-125 MG PO TABS: 1.0000 | ORAL_TABLET | Freq: Two times a day (BID) | ORAL | Status: AC

## 2014-04-20 MED ORDER — HYDROCOD POLST-CHLORPHEN POLST 10-8 MG/5ML PO LQCR
5.0000 mL | Freq: Every evening | ORAL | Status: DC | PRN
Start: 2014-04-20 — End: 2014-10-13

## 2014-04-20 NOTE — Patient Instructions (Signed)
Great to meet you. Take Augmentin as directed- 1 tablet twice daily x 10 days.  Cough suppressant as needed at bedtime.  Please make an appointment with Dr. Reece AgarG on your way out.

## 2014-04-20 NOTE — Progress Notes (Signed)
Pre visit review using our clinic review tool, if applicable. No additional management support is needed unless otherwise documented below in the visit note. 

## 2014-04-20 NOTE — Assessment & Plan Note (Signed)
New- Given duration and progression of symptoms, will treat for bacterial sinusitis with Augmentin twice daily x 10 days. Advised OTC steroid nasal spray, continued antihistamine. Rx given for tussionex to use prn at night- discussed sedation precautions.  Symptomatic therapy suggested: push fluids, rest and return office visit prn if symptoms persist or worsen.

## 2014-04-20 NOTE — Progress Notes (Signed)
SUBJECTIVE:  Christopher Ballard is a 53 y.o. male pt of Dr. Reece AgarG, new to me, who complains of coryza, congestion, sneezing, post nasal drip, productive cough and green nasal discharge for 9 days.  Feels symptoms are worsening despite taking "everything over the counter."  He denies a history of shortness of breath, sweats, vomiting and weakness and denies a history of asthma. Patient denies smoke cigarettes.   No current outpatient prescriptions on file prior to visit.   No current facility-administered medications on file prior to visit.    No Known Allergies  No past medical history on file.  Past Surgical History  Procedure Laterality Date  . Wisdom tooth extraction  1987  . Colonoscopy  07/2012    int hemorrhoids Leone Payor([facility])    Family History  Problem Relation Age of Onset  . Seizures Father   . Healthy Mother   . Healthy Brother   . Healthy Sister   . Colon cancer Neg Hx     History   Social History  . Marital Status: Married    Spouse Name: N/A    Number of Children: 3  . Years of Education: N/A   Occupational History  . Self-employed    Social History Main Topics  . Smoking status: Never Smoker   . Smokeless tobacco: Current User    Types: Chew  . Alcohol Use: Yes     Comment: Occasional  . Drug Use: No  . Sexual Activity: Not on file   Other Topics Concern  . Not on file   Social History Narrative   Caffeine: 1 cup coffee/day; 1 drink/day   Lives alone   Married   3 grown children   3 dogs   1 cat   Occupation: self employed (septic system)   Activity: T25   Diet: good water, fruits/vegetables daily   The PMH, PSH, Social History, Family History, Medications, and allergies [facility], and have been updated if relevant.  OBJECTIVE: BP 110/68 mmHg  Pulse 68  Temp(Src) 98.3 F (36.8 C) (Oral)  Wt 212 lb 12 oz (96.503 kg)  SpO2 95%  He appears well, vital signs are as noted. Ears normal.  Throat and pharynx normal.  Neck supple. No  adenopathy in the neck. Nose is congested. Sinuses +/- tender. The chest is clear, without wheezes or rales.

## 2014-04-21 ENCOUNTER — Telehealth: Payer: Self-pay | Admitting: Family Medicine

## 2014-04-21 NOTE — Telephone Encounter (Signed)
emmi emialed °

## 2014-10-01 DIAGNOSIS — M7542 Impingement syndrome of left shoulder: Secondary | ICD-10-CM

## 2014-10-01 DIAGNOSIS — M24112 Other articular cartilage disorders, left shoulder: Secondary | ICD-10-CM

## 2014-10-01 DIAGNOSIS — M7522 Bicipital tendinitis, left shoulder: Secondary | ICD-10-CM

## 2014-10-01 HISTORY — DX: Other articular cartilage disorders, left shoulder: M24.112

## 2014-10-01 HISTORY — DX: Bicipital tendinitis, left shoulder: M75.22

## 2014-10-01 HISTORY — DX: Impingement syndrome of left shoulder: M75.42

## 2014-10-13 ENCOUNTER — Encounter (HOSPITAL_BASED_OUTPATIENT_CLINIC_OR_DEPARTMENT_OTHER): Payer: Self-pay | Admitting: *Deleted

## 2014-10-16 ENCOUNTER — Other Ambulatory Visit: Payer: Self-pay | Admitting: Physician Assistant

## 2014-10-18 NOTE — H&P (Signed)
[facility] [address]   [address] [place], [place] [id number] [phone] A Division of [facility]  Loreta Aveaniel F. Murphy, M.D.   Robert A. Thurston HoleWainer, M.D.   Burnell BlanksW. Dan Caffrey, M.D.   Eulas PostJoshua P. Landau, M.D.   Lunette StandsAnna Voytek, M.D Jewel Baizeimothy D. Eulah PontMurphy, M.D.  Buford DresserWesley R. Ibazebo, M.D.  Estell HarpinJames S. Kramer, M.D.    Melina Fiddlerebecca S. Bassett, M.D. Janalee DaneBrittney Kelly, PA- C  Mary L. Dub MikesStanbery, PA-C  Kirstin A. Shepperson, PA-C  Josh Maryvillehadwell, PA-C  [place]andon Parry, [place]A-C   RE: Christopher ParcelOverbey, Theadore                                [phone]      DOB: 05/31/1961 PROGRESS NOTE: 09-12-14 Christopher BameRonnie is a new patient to the office.  Very active healthy 54 year-old male.  He owns his own [employer].  Presents for evaluation and treatment recommendation for bilateral shoulder pain, left greater than right.  Both insidious onset.  Both worse when he tries to go overhead or to bring his arm behind his back.  Worse with activity, better with rest.  Left has been symptomatic going on two years.  Getting worse.  Starting to wake him up at night.  The right for the last 3-4 months.  History of an AC separation, right shoulder, 30 years ago.  Closed treatment.  He has done well with that.  A little bit of neck stiffness, but no distal neurovascular symptoms.  No intervention, scans or workup of either shoulder or his neck.   Remaining history and general exam is outlined and included in the chart.  EXAMINATION: Specifically, this is a healthy appearing 54 year-old.  Height: 5?10.  Weight: 200 pounds.  He has fairly good cervical motion, just a little stiff, but really no pain.  He is neurovascularly intact without radicular signs.  Both shoulders have just about full motion, lacking a little internal rotation.  Markedly positive impingement, left greater than right.  Positive palms down abduction, left greater than right.  No apprehension or instability, both sides.  No atrophy.  Biceps  intact.  A little prominence AC joint on the right, more than on the left.  Pain with cross chest adduction about equal right and left.    X-RAYS: Two view x-rays of the cervical spine shows degenerative disc disease and narrowing at C5-6 and lesser extent 6-7.  Nothing acute.  X-rays of both shoulders show a Type II acromion.  Degenerative changes AC joint.  Reasonable subacromial space.  There is ossification at the CC ligament interval on the right that looks chronic.  No significant glenohumeral arthritis.    IMPRESSION: 1. Although he has degenerative disc disease and narrowing cervical spine C5-6 and C6-7, I don't think this is playing a current part of his symptoms and I don't think his shoulder issues are radicular.  2. Bilateral impingement, left greater than right.  AC separation healed on the right, remote.  We are going to do a diagnostic/therapeutic subacromial injection of both shoulders.  This is to help sort out and be sure that the symptoms are from his shoulder and not his neck.  Jobe exercise program outlined.  I want to hear from him after injection.  I am hopeful that one injection and exercises may settle down the right, but I doubt that is going to be the case on the left based on the  fact that it has been going on for two years.  If we at least get transient improvement with Marcaine the next step would be an MRI and then decompression of the left shoulder.  I went through all of this with him and he understands.  I am going to wait to hear from him.    Continued   [facility] [address]   [address] [place], [place] [id number] [phone] A Division of [facility]  Loreta Aveaniel F. Murphy, M.D.   Robert A. Thurston HoleWainer, M.D.   Burnell BlanksW. Dan Caffrey, M.D.   Eulas PostJoshua P. Landau, M.D.   Lunette StandsAnna Voytek, M.D Jewel Baizeimothy D. Eulah PontMurphy, M.D.  Buford DresserWesley R. Ibazebo, M.D.  Estell HarpinJames S. Kramer, M.D.    Melina Fiddlerebecca S. Bassett, M.D. Janalee DaneBrittney Kelly, PA- C  Mary L.  Dub MikesStanbery, PA-C  Kirstin A. Shepperson, PA-C  Josh Attu Stationhadwell, PA-C  [place]andon Parry, [place]A-C   RE: Christopher ParcelOverbey, Berthel                                [phone]      DOB: 08/05/1960 PROGRESS NOTE: 09-12-14 PROCEDURE NOTE: The patient's clinical condition is marked by substantial pain and/or significant functional disability.  Other conservative therapy has not provided relief, is contraindicated, or not appropriate.  There is a reasonable likelihood that injection will significantly improve the patient's pain and/or functional disability. After appropriate consent and under sterile technique subacromial injection of both shoulders with Depo-Medrol/Marcaine.  Tolerated this well.  Good relief initially with Marcaine in place.    Loreta Aveaniel F. Murphy, M.D.   Electronically verified by Loreta Aveaniel F. Murphy, M.D. DFM:jjh D 09-13-14 T 09-14-14   [facility] [address]   [address] [place], [place] [phone] A Division of [facility]  Loreta Aveaniel F. Murphy, M.D.   Robert A. Thurston HoleWainer, M.D.   Burnell BlanksW. Dan Caffrey, M.D.   Eulas PostJoshua P. Landau, M.D.   Lunette StandsAnna Voytek, M.D Jewel Baizeimothy D. Eulah PontMurphy, M.D.  Buford DresserWesley R. Ibazebo, M.D.  Estell HarpinJames S. Kramer, M.D.    Melina Fiddlerebecca S. Bassett, M.D. Janalee DaneBrittney Kelly, PA- C  Mary L. Dub MikesStanbery, PA-C  Kirstin A. Shepperson, PA-C  Josh Prince Frederickhadwell, PA-C  [place]andon Parry, [place]C   RE: Christopher ParcelOverbey, Larnell                                [phone]      DOB: 12/02/1960 PROGRESS NOTE: 10-10-14 Christopher BameRonnie comes in to review his MRI of his left shoulder.  MRI from Oct 09, 2014 reveals a partial thickness articular surface tear of the subscapularis with subluxation of the long head of the biceps tendon into the subscapularis.  His left shoulder has been bothering him for quite sometime and it has gotten to the point where it is affecting his activities of daily living.  At this point we have suggested a left shoulder arthroscopic decompression and rotator cuff repair and  probable open biceps tenodesis.  Tonny agrees.  We have filled out paperwork to proceed.  Risks, benefits and possible complications reviewed.  Rehab and recovery time discussed. All questions answered.  We will see Ronie at the time of operative intervention.    Loreta Aveaniel F. Murphy, M.D.   Dictated by: Tessa LernerLindsey Stanbery, PA-C Electronically verified by Loreta Aveaniel F. Murphy, M.D. DFM(LS):jjh D 10-10-14 T 10-11-14

## 2014-10-19 ENCOUNTER — Ambulatory Visit (HOSPITAL_BASED_OUTPATIENT_CLINIC_OR_DEPARTMENT_OTHER): Payer: BLUE CROSS/BLUE SHIELD | Admitting: Anesthesiology

## 2014-10-19 ENCOUNTER — Encounter (HOSPITAL_BASED_OUTPATIENT_CLINIC_OR_DEPARTMENT_OTHER)
Admission: RE | Disposition: A | Discharge status: Home / Self Care | Payer: Self-pay | Source: Ambulatory Visit | Attending: Orthopedic Surgery

## 2014-10-19 ENCOUNTER — Other Ambulatory Visit: Payer: Self-pay | Admitting: Physician Assistant

## 2014-10-19 ENCOUNTER — Ambulatory Visit (HOSPITAL_BASED_OUTPATIENT_CLINIC_OR_DEPARTMENT_OTHER)
Admission: RE | Admit: 2014-10-19 | Discharge: 2014-10-19 | Disposition: A | Discharge status: Home / Self Care | Payer: BLUE CROSS/BLUE SHIELD | Source: Ambulatory Visit | Attending: Orthopedic Surgery | Admitting: Orthopedic Surgery

## 2014-10-19 ENCOUNTER — Encounter (HOSPITAL_BASED_OUTPATIENT_CLINIC_OR_DEPARTMENT_OTHER): Payer: Self-pay | Admitting: *Deleted

## 2014-10-19 DIAGNOSIS — M24012 Loose body in left shoulder: Secondary | ICD-10-CM | POA: Diagnosis not present

## 2014-10-19 DIAGNOSIS — M5032 Other cervical disc degeneration, mid-cervical region: Secondary | ICD-10-CM | POA: Insufficient documentation

## 2014-10-19 DIAGNOSIS — X58XXXA Exposure to other specified factors, initial encounter: Secondary | ICD-10-CM | POA: Diagnosis not present

## 2014-10-19 DIAGNOSIS — M7542 Impingement syndrome of left shoulder: Secondary | ICD-10-CM | POA: Insufficient documentation

## 2014-10-19 DIAGNOSIS — S46812A Strain of other muscles, fascia and tendons at shoulder and upper arm level, left arm, initial encounter: Secondary | ICD-10-CM | POA: Insufficient documentation

## 2014-10-19 DIAGNOSIS — M19012 Primary osteoarthritis, left shoulder: Secondary | ICD-10-CM | POA: Insufficient documentation

## 2014-10-19 DIAGNOSIS — Y929 Unspecified place or not applicable: Secondary | ICD-10-CM | POA: Insufficient documentation

## 2014-10-19 HISTORY — DX: Bicipital tendinitis, left shoulder: M75.22

## 2014-10-19 HISTORY — DX: Gastro-esophageal reflux disease without esophagitis: K21.9

## 2014-10-19 HISTORY — DX: Dental restoration status: Z98.811

## 2014-10-19 HISTORY — PX: BICEPT TENODESIS: SHX5116

## 2014-10-19 HISTORY — DX: Other articular cartilage disorders, left shoulder: M24.112

## 2014-10-19 HISTORY — DX: Impingement syndrome of left shoulder: M75.42

## 2014-10-19 LAB — POCT HEMOGLOBIN-HEMACUE: Hemoglobin: 15.5 g/dL (ref 13.0–17.0)

## 2014-10-19 SURGERY — TENODESIS, BICEPS
Anesthesia: General | Site: Shoulder | Laterality: Left

## 2014-10-19 MED ORDER — CEFAZOLIN SODIUM-DEXTROSE 2-3 GM-% IV SOLR
INTRAVENOUS | Status: AC
  Filled 2014-10-19: qty 50

## 2014-10-19 MED ORDER — BUPIVACAINE HCL (PF) 0.25 % IJ SOLN
INTRAMUSCULAR | Status: AC
  Filled 2014-10-19: qty 120

## 2014-10-19 MED ORDER — FENTANYL CITRATE (PF) 100 MCG/2ML IJ SOLN
INTRAMUSCULAR | Status: AC
  Filled 2014-10-19: qty 2

## 2014-10-19 MED ORDER — BUPIVACAINE-EPINEPHRINE (PF) 0.5% -1:200000 IJ SOLN
INTRAMUSCULAR | Status: DC | PRN
  Administered 2014-10-19: 25 mL via PERINEURAL

## 2014-10-19 MED ORDER — OXYCODONE HCL 5 MG PO TABS: 5.0000 mg | ORAL_TABLET | Freq: Once | ORAL | Status: DC | PRN

## 2014-10-19 MED ORDER — PROPOFOL 10 MG/ML IV BOLUS
INTRAVENOUS | Status: DC | PRN
  Administered 2014-10-19: 200 mg via INTRAVENOUS

## 2014-10-19 MED ORDER — MIDAZOLAM HCL 2 MG/2ML IJ SOLN
INTRAMUSCULAR | Status: AC
  Filled 2014-10-19: qty 2

## 2014-10-19 MED ORDER — ONDANSETRON HCL 4 MG/2ML IJ SOLN
INTRAMUSCULAR | Status: DC | PRN
  Administered 2014-10-19: 4 mg via INTRAVENOUS

## 2014-10-19 MED ORDER — BUPIVACAINE HCL (PF) 0.5 % IJ SOLN
INTRAMUSCULAR | Status: AC
  Filled 2014-10-19: qty 120

## 2014-10-19 MED ORDER — OXYCODONE HCL 5 MG/5ML PO SOLN: 5.0000 mg | Freq: Once | ORAL | Status: DC | PRN

## 2014-10-19 MED ORDER — SUCCINYLCHOLINE CHLORIDE 20 MG/ML IJ SOLN
INTRAMUSCULAR | Status: DC | PRN
  Administered 2014-10-19: 100 mg via INTRAVENOUS

## 2014-10-19 MED ORDER — SODIUM CHLORIDE 0.9 % IR SOLN
Status: DC | PRN
Start: 2014-10-19 — End: 2014-10-19
  Administered 2014-10-19: 6000 mL

## 2014-10-19 MED ORDER — MEPERIDINE HCL 25 MG/ML IJ SOLN: 6.2500 mg | INTRAMUSCULAR | Status: DC | PRN

## 2014-10-19 MED ORDER — HYDROMORPHONE HCL 1 MG/ML IJ SOLN: 0.2500 mg | INTRAMUSCULAR | Status: DC | PRN

## 2014-10-19 MED ORDER — GLYCOPYRROLATE 0.2 MG/ML IJ SOLN: 0.2000 mg | Freq: Once | INTRAMUSCULAR | Status: DC | PRN

## 2014-10-19 MED ORDER — OXYCODONE-ACETAMINOPHEN 5-325 MG PO TABS: 1.0000 | ORAL_TABLET | ORAL | Status: DC | PRN

## 2014-10-19 MED ORDER — DEXAMETHASONE SODIUM PHOSPHATE 4 MG/ML IJ SOLN
INTRAMUSCULAR | Status: DC | PRN
  Administered 2014-10-19: 10 mg via INTRAVENOUS

## 2014-10-19 MED ORDER — METHYLPREDNISOLONE ACETATE 80 MG/ML IJ SUSP
INTRAMUSCULAR | Status: AC
  Filled 2014-10-19: qty 4

## 2014-10-19 MED ORDER — FENTANYL CITRATE (PF) 100 MCG/2ML IJ SOLN
50.0000 ug | INTRAMUSCULAR | Status: DC | PRN
  Administered 2014-10-19: 100 ug via INTRAVENOUS

## 2014-10-19 MED ORDER — LACTATED RINGERS IV SOLN
INTRAVENOUS | Status: DC
  Administered 2014-10-19: 10:00:00 via INTRAVENOUS

## 2014-10-19 MED ORDER — LACTATED RINGERS IV SOLN: INTRAVENOUS | Status: DC

## 2014-10-19 MED ORDER — LIDOCAINE HCL (CARDIAC) 20 MG/ML IV SOLN
INTRAVENOUS | Status: DC | PRN
  Administered 2014-10-19: 80 mg via INTRAVENOUS

## 2014-10-19 MED ORDER — CEFAZOLIN SODIUM-DEXTROSE 2-3 GM-% IV SOLR
2.0000 g | INTRAVENOUS | Status: AC
  Administered 2014-10-19: 2 g via INTRAVENOUS

## 2014-10-19 MED ORDER — METHOCARBAMOL 500 MG PO TABS: 500.0000 mg | ORAL_TABLET | Freq: Four times a day (QID) | ORAL | Status: DC

## 2014-10-19 MED ORDER — ONDANSETRON HCL 4 MG PO TABS: 4.0000 mg | ORAL_TABLET | Freq: Three times a day (TID) | ORAL | Status: DC | PRN

## 2014-10-19 MED ORDER — MIDAZOLAM HCL 2 MG/2ML IJ SOLN
1.0000 mg | INTRAMUSCULAR | Status: DC | PRN
  Administered 2014-10-19: 2 mg via INTRAVENOUS

## 2014-10-19 MED ORDER — BUPIVACAINE-EPINEPHRINE (PF) 0.5% -1:200000 IJ SOLN
INTRAMUSCULAR | Status: AC
  Filled 2014-10-19: qty 120

## 2014-10-19 MED ORDER — CHLORHEXIDINE GLUCONATE 4 % EX LIQD: 60.0000 mL | Freq: Once | CUTANEOUS | Status: DC

## 2014-10-19 SURGICAL SUPPLY — 68 items
BENZOIN TINCTURE PRP APPL 2/3 (GAUZE/BANDAGES/DRESSINGS) IMPLANT
BIT DRILL 7/64X5 DISP (BIT) ×2 IMPLANT
BLADE CUTTER GATOR 3.5 (BLADE) ×2 IMPLANT
BLADE CUTTER MENIS 5.5 (BLADE) IMPLANT
BLADE GREAT WHITE 4.2 (BLADE) ×2 IMPLANT
BLADE SURG 15 STRL LF DISP TIS (BLADE) ×1 IMPLANT
BLADE SURG 15 STRL SS (BLADE) ×1
BUR OVAL 6.0 (BURR) ×2 IMPLANT
CANNULA DRY DOC 8X75 (CANNULA) IMPLANT
CANNULA TWIST IN 8.25X7CM (CANNULA) IMPLANT
DECANTER SPIKE VIAL GLASS SM (MISCELLANEOUS) IMPLANT
DRAPE STERI 35X30 U-POUCH (DRAPES) ×2 IMPLANT
DRAPE U-SHAPE 47X51 STRL (DRAPES) ×2 IMPLANT
DRAPE U-SHAPE 76X120 STRL (DRAPES) ×4 IMPLANT
DRSG PAD ABDOMINAL 8X10 ST (GAUZE/BANDAGES/DRESSINGS) ×2 IMPLANT
DURAPREP 26ML APPLICATOR (WOUND CARE) ×2 IMPLANT
ELECT MENISCUS 165MM 90D (ELECTRODE) ×2 IMPLANT
ELECT REM PT RETURN 9FT ADLT (ELECTROSURGICAL) ×2
ELECTRODE REM PT RTRN 9FT ADLT (ELECTROSURGICAL) ×1 IMPLANT
GAUZE SPONGE 4X4 12PLY STRL (GAUZE/BANDAGES/DRESSINGS) ×4 IMPLANT
GAUZE XEROFORM 1X8 LF (GAUZE/BANDAGES/DRESSINGS) ×2 IMPLANT
GLOVE BIOGEL PI IND STRL 7.0 (GLOVE) ×1 IMPLANT
GLOVE BIOGEL PI INDICATOR 7.0 (GLOVE) ×1
GLOVE ECLIPSE 7.0 STRL STRAW (GLOVE) ×2 IMPLANT
GLOVE ORTHO TXT STRL SZ7.5 (GLOVE) IMPLANT
GLOVE SURG ORTHO 8.0 STRL STRW (GLOVE) ×4 IMPLANT
GOWN STRL REUS W/ TWL LRG LVL3 (GOWN DISPOSABLE) ×2 IMPLANT
GOWN STRL REUS W/ TWL XL LVL3 (GOWN DISPOSABLE) ×1 IMPLANT
GOWN STRL REUS W/TWL LRG LVL3 (GOWN DISPOSABLE) ×2
GOWN STRL REUS W/TWL XL LVL3 (GOWN DISPOSABLE) ×1
IV NS IRRIG 3000ML ARTHROMATIC (IV SOLUTION) ×8 IMPLANT
MANIFOLD NEPTUNE II (INSTRUMENTS) ×2 IMPLANT
NDL SUT 6 .5 CRC .975X.05 MAYO (NEEDLE) IMPLANT
NEEDLE MAYO TAPER (NEEDLE)
NEEDLE SCORPION MULTI FIRE (NEEDLE) IMPLANT
NS IRRIG 1000ML POUR BTL (IV SOLUTION) IMPLANT
PACK ARTHROSCOPY DSU (CUSTOM PROCEDURE TRAY) ×2 IMPLANT
PACK BASIN DAY SURGERY FS (CUSTOM PROCEDURE TRAY) ×2 IMPLANT
PASSER SUT SWANSON 36MM LOOP (INSTRUMENTS) ×4 IMPLANT
PENCIL BUTTON HOLSTER BLD 10FT (ELECTRODE) ×2 IMPLANT
SET ARTHROSCOPY TUBING (MISCELLANEOUS) ×1
SET ARTHROSCOPY TUBING LN (MISCELLANEOUS) ×1 IMPLANT
SLEEVE SCD COMPRESS KNEE MED (MISCELLANEOUS) ×2 IMPLANT
SLING ARM IMMOBILIZER LRG (SOFTGOODS) IMPLANT
SLING ARM IMMOBILIZER MED (SOFTGOODS) IMPLANT
SLING ARM LRG ADULT FOAM STRAP (SOFTGOODS) ×2 IMPLANT
SLING ARM MED ADULT FOAM STRAP (SOFTGOODS) IMPLANT
SLING ARM XL FOAM STRAP (SOFTGOODS) IMPLANT
SPONGE LAP 4X18 X RAY DECT (DISPOSABLE) ×2 IMPLANT
STRIP CLOSURE SKIN 1/2X4 (GAUZE/BANDAGES/DRESSINGS) ×2 IMPLANT
SUCTION FRAZIER TIP 10 FR DISP (SUCTIONS) ×2 IMPLANT
SUT ETHIBOND 2 OS 4 DA (SUTURE) IMPLANT
SUT ETHILON 2 0 FS 18 (SUTURE) IMPLANT
SUT ETHILON 3 0 PS 1 (SUTURE) ×2 IMPLANT
SUT FIBERWIRE #2 38 T-5 BLUE (SUTURE) ×2
SUT RETRIEVER MED (INSTRUMENTS) IMPLANT
SUT TIGER TAPE 7 IN WHITE (SUTURE) IMPLANT
SUT VIC AB 0 CT1 27 (SUTURE)
SUT VIC AB 0 CT1 27XBRD ANBCTR (SUTURE) IMPLANT
SUT VIC AB 2-0 SH 27 (SUTURE) ×1
SUT VIC AB 2-0 SH 27XBRD (SUTURE) ×1 IMPLANT
SUT VIC AB 3-0 FS2 27 (SUTURE) IMPLANT
SUTURE FIBERWR #2 38 T-5 BLUE (SUTURE) ×1 IMPLANT
TAPE FIBER 2MM 7IN #2 BLUE (SUTURE) IMPLANT
TOWEL OR 17X24 6PK STRL BLUE (TOWEL DISPOSABLE) ×2 IMPLANT
TOWEL OR NON WOVEN STRL DISP B (DISPOSABLE) ×2 IMPLANT
WATER STERILE IRR 1000ML POUR (IV SOLUTION) ×2 IMPLANT
YANKAUER SUCT BULB TIP NO VENT (SUCTIONS) ×2 IMPLANT

## 2014-10-19 NOTE — Discharge Instructions (Signed)
Shouder arthroscopy, partial rotator cuff tear debridement subacromial decompression Care After Instructions Refer to this sheet in the next few weeks. These discharge instructions provide you with general information on caring for yourself after you leave the hospital. Your caregiver may also give you specific instructions. Your treatment has been planned according to the most current medical practices available, but unavoidable complications sometimes occur. If you have any problems or questions after discharge, please call your caregiver. HOME INSTRUCTIONS You may resume a normal diet and activities as directed. Take showers instead of baths until informed otherwise.  Do not remove bandages until follow up appointment Only take over-the-counter or prescription medicines for pain, discomfort, or fever as directed by your caregiver.  Wear your sling at all times for the next 4 weeks Eat a well-balanced diet.  Avoid lifting or driving until you are instructed otherwise.  Make an appointment to see your caregiver for stitches (suture) or staple removal one week after surgery.  SEEK MEDICAL CARE IF: You have swelling of your calf or leg.  You develop shortness of breath or chest pain.  You have redness, swelling, or increasing pain in the wound.  There is pus or any unusual drainage coming from the surgical site.  You notice a bad smell coming from the surgical site or dressing.  The surgical site breaks open after sutures or staples have been removed.  There is persistent bleeding from the suture or staple line.  You are getting worse or are not improving.  You have any other questions or concerns.  SEEK IMMEDIATE MEDICAL CARE IF:  You have a fever greater than 101 You develop a rash.  You have difficulty breathing.  You develop any reaction or side effects to medicines given.  Your knee motion is decreasing rather than improving.  MAKE SURE YOU:  Understand these instructions.  Will  watch your condition.  Will get help right away if you are not doing well or get worse.     Post Anesthesia Home Care Instructions  Activity: Get plenty of rest for the remainder of the day. A responsible adult should stay with you for 24 hours following the procedure.  For the next 24 hours, DO NOT: -Drive a car -Advertising copywriterperate machinery -Drink alcoholic beverages -Take any medication unless instructed by your physician -Make any legal decisions or sign important papers.  Meals: Start with liquid foods such as gelatin or soup. Progress to regular foods as tolerated. Avoid greasy, spicy, heavy foods. If nausea and/or vomiting occur, drink only clear liquids until the nausea and/or vomiting subsides. Call your physician if vomiting continues.  Special Instructions/Symptoms: Your throat may feel dry or sore from the anesthesia or the breathing tube placed in your throat during surgery. If this causes discomfort, gargle with warm salt water. The discomfort should disappear within 24 hours.  If you had a scopolamine patch placed behind your ear for the management of post- operative nausea and/or vomiting:  1. The medication in the patch is effective for 72 hours, after which it should be removed.  Wrap patch in a tissue and discard in the trash. Wash hands thoroughly with soap and water. 2. You may remove the patch earlier than 72 hours if you experience unpleasant side effects which may include dry mouth, dizziness or visual disturbances. 3. Avoid touching the patch. Wash your hands with soap and water after contact with the patch.   Regional Anesthesia Blocks  1. Numbness or the inability to move the "blocked" extremity  may last from 3-48 hours after placement. The length of time depends on the medication injected and your individual response to the medication. If the numbness is not going away after 48 hours, call your surgeon.  2. The extremity that is blocked will need to be protected until  the numbness is gone and the  Strength has returned. Because you cannot feel it, you will need to take extra care to avoid injury. Because it may be weak, you may have difficulty moving it or using it. You may not know what position it is in without looking at it while the block is in effect.  3. For blocks in the legs and feet, returning to weight bearing and walking needs to be done carefully. You will need to wait until the numbness is entirely gone and the strength has returned. You should be able to move your leg and foot normally before you try and bear weight or walk. You will need someone to be with you when you first try to ensure you do not fall and possibly risk injury.  4. Bruising and tenderness at the needle site are common side effects and will resolve in a few days.  5. Persistent numbness or new problems with movement should be communicated to the surgeon or the [facility] [phone])/ [facility] [place] [phone]).

## 2014-10-19 NOTE — Interval H&P Note (Signed)
History and Physical Interval Note:  10/19/2014 7:32 AM  Christopher Ballard  has presented today for surgery, with the diagnosis of OTHER ARTICULAR CARTILAGE DISORDER, LEFT SHOUDLER IMPINGEMENT SYNDROME OF SYNDROME OF LEFT SHOULDER STRAIN OF MUSCLES AND TENDONS OF THE ROTATOR CUFF OF LEFT SHOULDER INITIAL ENCOUNTER BICIPITAL   The various methods of treatment have been discussed with the patient and family. After consideration of risks, benefits and other options for treatment, the patient has consented to  Procedure(s): LEFT SHOULDER ARTHROSCOPY DEBRIDEMENT ACROMIOPLASTY,  DISTAL CLAVICAL EXCISION,  ARTHROSCOPIC BICEPS TENODESIS VERSES OPEN BICEPS TENODESIS (Left) as a surgical intervention .  The patient's history has been reviewed, patient examined, no change in status, stable for surgery.  I have reviewed the patient's chart and labs.  Questions were answered to the patient's satisfaction.     [name]

## 2014-10-19 NOTE — Transfer of Care (Signed)
Immediate Anesthesia Transfer of Care Note  Patient: Christopher Ballard  Procedure(s) Performed: Procedure(s): BICEPS TENODESIS (Left) SHOULDER ARTHROSCOPY WITH SUBACROMIAL DECOMPRESSION AND DISTAL CLAVICLE EXCISION (Left)  Patient Location: PACU  Anesthesia Type:General  Level of Consciousness: awake, alert  and oriented  Airway & Oxygen Therapy: Patient Spontanous Breathing and Patient connected to face mask oxygen  Post-op Assessment: Report given to RN and Post -op Vital signs reviewed and stable  Post vital signs: Reviewed and stable  Last Vitals:  Filed Vitals:   10/19/14 1007  BP:   Pulse: 55  Temp:   Resp: 11    Complications: No apparent anesthesia complications

## 2014-10-19 NOTE — Anesthesia Procedure Notes (Addendum)
Anesthesia Regional Block:  Interscalene brachial plexus block  Pre-Anesthetic Checklist: ,, timeout performed, Correct Patient, Correct Site, Correct Laterality, Correct Procedure, Correct Position, site marked, Risks and benefits discussed,  Surgical consent,  Pre-op evaluation,  At surgeon's request and post-op pain management  Laterality: Left and Upper  Prep: chloraprep       Needles:  Injection technique: Single-shot  Needle Type: Echogenic Needle     Needle Length: 5cm 5 cm Needle Gauge: 21 and 21 G    Additional Needles:  Procedures: ultrasound guided (picture in chart) Interscalene brachial plexus block Narrative:  Start time: 10/19/2014 9:58 AM End time: 10/19/2014 10:07 AM Injection made incrementally with aspirations every 5 mL.  Performed by: Personally  Anesthesiologist: CREWS, DAVID   Procedure Name: Intubation Performed by: York GricePEARSON, Geri Hepler W Pre-anesthesia Checklist: Patient identified, Timeout performed, Emergency Drugs available, Suction available and Patient being monitored Patient Re-evaluated:Patient Re-evaluated prior to inductionOxygen Delivery Method: Circle system utilized Preoxygenation: Pre-oxygenation with 100% oxygen Intubation Type: IV induction Ventilation: Mask ventilation without difficulty Laryngoscope Size: Miller and 2 Grade View: Grade I Tube type: Oral Tube size: 7.0 mm Number of attempts: 1 Airway Equipment and Method: Stylet Placement Confirmation: ETT inserted through vocal cords under direct vision,  breath sounds checked- equal and bilateral and positive ETCO2 Secured at: 22 cm Tube secured with: Tape Dental Injury: Teeth and Oropharynx as per pre-operative assessment

## 2014-10-19 NOTE — Anesthesia Preprocedure Evaluation (Signed)
Anesthesia Evaluation  Patient identified by MRN, date of birth, ID band Patient awake    Reviewed: Allergy & Precautions, NPO status , Unable to perform ROS - Chart review only  Airway Mallampati: I  TM Distance: >3 FB Neck ROM: Full    Dental  (+) Teeth Intact, Dental Advisory Given   Pulmonary  breath sounds clear to auscultation        Cardiovascular Rhythm:Regular Rate:Normal     Neuro/Psych    GI/Hepatic   Endo/Other    Renal/GU      Musculoskeletal   Abdominal   Peds  Hematology   Anesthesia Other Findings   Reproductive/Obstetrics                             Anesthesia Physical Anesthesia Plan  ASA: I  Anesthesia Plan: General   Post-op Pain Management: MAC Combined w/ Regional for Post-op pain   Induction: Intravenous  Airway Management Planned: Oral ETT  Additional Equipment:   Intra-op Plan:   Post-operative Plan: Extubation in OR  Informed Consent: I have reviewed the patients History and Physical, chart, labs and discussed the procedure including the risks, benefits and alternatives for the proposed anesthesia with the patient or authorized representative who has indicated his/her understanding and acceptance.   Dental advisory given  Plan Discussed with: CRNA, Anesthesiologist and Surgeon  Anesthesia Plan Comments:         Anesthesia Quick Evaluation

## 2014-10-19 NOTE — Progress Notes (Signed)
Assisted Dr. Crews with left, ultrasound guided, interscalene  block. Side rails up, monitors on throughout procedure. See vital signs in flow sheet. Tolerated Procedure well. 

## 2014-10-19 NOTE — Anesthesia Postprocedure Evaluation (Signed)
  Anesthesia Post-op Note  Patient: Christopher Ballard  Procedure(s) Performed: Procedure(s): BICEPS TENODESIS (Left) SHOULDER ARTHROSCOPY WITH SUBACROMIAL DECOMPRESSION AND DISTAL CLAVICLE EXCISION (Left)  Patient Location: PACU  Anesthesia Type: General ,regional  Level of Consciousness: awake, alert  and oriented  Airway and Oxygen Therapy: Patient Spontanous Breathing  Post-op Pain: none  Post-op Assessment: Post-op Vital signs reviewed  Post-op Vital Signs: Reviewed  Last Vitals:  Filed Vitals:   10/19/14 1317  BP: 121/91  Pulse: 63  Temp: 36.8 C  Resp: 16    Complications: No apparent anesthesia complications

## 2014-10-20 ENCOUNTER — Encounter (HOSPITAL_BASED_OUTPATIENT_CLINIC_OR_DEPARTMENT_OTHER): Payer: Self-pay | Admitting: Orthopedic Surgery

## 2014-10-23 NOTE — Op Note (Signed)
NAMSu Hoff:  Liebler, Elier              ACCOUNT NO.:  [id number]  MEDICAL RECORD NO.:  [id number]  LOCATION:                                FACILITY:  [facility]  PHYSICIAN:  Loreta Aveaniel F. Zakariah Dejarnette, M.D. DATE OF BIRTH:  03/22/1961  DATE OF PROCEDURE:  10/19/2014 DATE OF DISCHARGE:  10/19/2014                              OPERATIVE REPORT   PREOPERATIVE DIAGNOSES:  Left shoulder partially torn subluxed biceps tendon with partial tearing subscap tendon.  Longstanding impingement with degenerative joint disease acromioclavicular joint.  POSTOPERATIVE DIAGNOSES:  Left shoulder partially torn subluxed biceps tendon with partial tearing subscap tendon.  Longstanding impingement with degenerative joint disease acromioclavicular joint with osteochondral injury anterior humeral head, full-thickness 6 mm diameter with chondral loose bodies.  PROCEDURE:  Left shoulder exam under anesthesia, arthroscopy.  Release intra-articular portion biceps tendon with extra-articular tenodesis into a docking tunnel with FiberWire suture.  Chondroplasty humeral head with micro fracturing.  Debridement rotator cuff above and below. Nothing requiring open repair.  Bursectomy acromioplasty CA ligament release.  Excision of distal clavicle.  SURGEON:  Loreta Aveaniel F. Duru Reiger, M.D.  ASSISTANT:  Mikey KirschnerLindsey Stanberry, PA., present throughout the entire case and necessary for timely completion of procedure.  ANESTHESIA:  General.  BLOOD LOSS:  Minimal.  SPECIMENS:  None.  CULTURES:  None.  COMPLICATIONS:  None.  DRESSINGS:  Soft compressive with sling.  DESCRIPTION OF PROCEDURE:  The patient was brought to the operating room, placed on the operating table in supine position.  After adequate anesthesia had been obtained, shoulder examined.  Full motion and stable shoulder.  Placed in a beach-chair position on the shoulder positioner, and prepped and draped in usual sterile fashion.  Three portals anterior, posterior, and  lateral.  Arthroscope introduced, shoulder was distended and inspected.  Long head biceps tendon markedly torn throughout.  Subluxed into a longitudinal split of the subscap.  Intra- articular portion resected, release for extra-articular tenodesis.  The subscap had a split, but was not attached laterally, so did not require further repair.  The other tendon had some tendinopathy, but basically intact.  Some tearing of the labrum at the top debrided.  On the front of the humeral head, a 6 mm full-thickness chondral injury what I think was interposing from the biceps tendon.  This was debrided back, loose bodies removed.  I then did microfracture of the humeral head. Instruments were fully removed.  Small incision over the bicipital groove.  Blunt dissection was used to open up the deltoid.  Biceps tendon brought out to appropriate length, cut, and captured with FiberWire suture.  Docking tunnel made in the bicipital groove with 2 small exiting tunnels.  Sutures __________ smaller ones.  The tendon advanced in the docking tunnel, suture tied over top.  __________ tenodesis.  Wound irrigated.  Deltoid allowed to close, subcutaneous subcuticular closure.  Cannula reintroduced subacromially.  A lot of bursitis debrided.  Acromioplasty from type 3 to type 1 acromion releasing CA ligament.  Some roughening on the top of the cuff, but no structural tears.  Grade 4 changes and marked spurring AC joint. Periarticular spurs lateral centimeter of clavicle resected.  Adequacy of decompression debridement confirmed viewing from  all portals. Instruments were fully removed.  Portals were closed with nylon. Sterile compressive dressing applied.  Sling applied.  Anesthesia reversed.  Brought to the recovery room.  He tolerated the surgery well without complications.     Loreta Ave, M.D.     DFM/MEDQ  D:  10/19/2014  T:  10/20/2014  Job:  [id number]

## 2015-03-19 ENCOUNTER — Encounter: Payer: Self-pay | Admitting: Family Medicine

## 2015-03-19 ENCOUNTER — Ambulatory Visit (INDEPENDENT_AMBULATORY_CARE_PROVIDER_SITE_OTHER): Payer: BLUE CROSS/BLUE SHIELD | Admitting: Family Medicine

## 2015-03-19 VITALS — BP 136/94 | HR 70 | Temp 98.5°F | Ht 70.0 in | Wt 217.5 lb

## 2015-03-19 DIAGNOSIS — H6120 Impacted cerumen, unspecified ear: Secondary | ICD-10-CM | POA: Insufficient documentation

## 2015-03-19 DIAGNOSIS — H6123 Impacted cerumen, bilateral: Secondary | ICD-10-CM | POA: Diagnosis not present

## 2015-03-19 NOTE — Progress Notes (Signed)
   Subjective:    Patient ID: Christopher Ballard, male    DOB: Jul 17, 1960, 54 y.o.   MRN: [id number]  HPI Here with ears stopped up   Thinks wax build up   R one is uncomfortable   Trouble hearing   Using a kit over the counter -got a little out   No allergy or cold symptoms  Does feel some drainage and ears pop  Patient Active Problem List   Diagnosis Date Noted  . Acute upper respiratory infection 04/20/2014  . Healthcare maintenance 05/19/2012  . ED (erectile dysfunction) 05/19/2012  . Lightheadedness 02/18/2011  . ACROCHORDON 05/06/2010  . NUMBNESS, ARM 05/12/2008   Past Medical History  Diagnosis Date  . GERD (gastroesophageal reflux disease)   . Impingement syndrome of left shoulder region 10/2014  . 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 10/2014  . Bicipital tendinitis of left shoulder 10/2014  . Dental crowns present    Past Surgical History  Procedure Laterality Date  . Wisdom tooth extraction  1987  . Colonoscopy with propofol  07/23/2012  . Bicept tenodesis Left 10/19/2014    Procedure: BICEPS TENODESIS;  Surgeon: Kathryne Hitch, MD;  Location: [facility];  Service: Orthopedics;  Laterality: Left;   Social History  Substance Use Topics  . Smoking status: Never Smoker   . Smokeless tobacco: Current User    Types: Chew  . Alcohol Use: Yes     Comment: 1 beer/night   Family History  Problem Relation Age of Onset  . Seizures Father    No Known Allergies No current outpatient prescriptions on file prior to visit.   No current facility-administered medications on file prior to visit.    Review of Systems Review of Systems  Constitutional: Negative for fever, appetite change, fatigue and unexpected weight change.  Eyes: Negative for pain and visual disturbance.  ENT pos for ear fullness and drainage , neg for congestion  Respiratory: Negative for cough and shortness of breath.   Cardiovascular: Negative for cp or palpitations      Gastrointestinal: Negative for nausea, diarrhea and constipation.  Genitourinary: Negative for urgency and frequency.  Skin: Negative for pallor or rash   Neurological: Negative for weakness, light-headedness, numbness and headaches.  Hematological: Negative for adenopathy. Does not bruise/bleed easily.  Psychiatric/Behavioral: Negative for dysphoric mood. The patient is not nervous/anxious.         Objective:   Physical Exam  Constitutional: He appears well-developed and well-nourished. No distress.  overwt and well appearing   HENT:  Head: Normocephalic and atraumatic.  Bilateral cerumen impaction - cleared with simple irrigation  TMs nl appearing after that with improved hearing   Mild clear post nasal drainage   Eyes: Conjunctivae and EOM are normal. Pupils are equal, round, and reactive to light. Right eye exhibits no discharge. Left eye exhibits no discharge.  Neck: Normal range of motion. Neck supple.  Pulmonary/Chest: Effort normal and breath sounds normal.  Lymphadenopathy:    He has no cervical adenopathy.  Skin: Skin is warm and dry. No rash noted. No erythema. No pallor.  Psychiatric: He has a normal mood and affect.          Assessment & Plan:

## 2015-03-19 NOTE — Progress Notes (Signed)
Pre visit review using our clinic review tool, if applicable. No additional management support is needed unless otherwise documented below in the visit note. 

## 2015-03-19 NOTE — Patient Instructions (Signed)
Ears look good after the irrigation  If any problems/ pain - let us know  The water should dry up with time

## 2015-03-19 NOTE — Assessment & Plan Note (Signed)
Bilateral with hearing loss- cleared with simple ear irrigation  inst given- pt exp relief in office  Can use ear wax product 1-2 times per month to avoid as much build up  Urged to contact us if other symptoms

## 2015-03-23 ENCOUNTER — Telehealth: Payer: Self-pay | Admitting: *Deleted

## 2015-03-23 NOTE — Telephone Encounter (Signed)
Patient was seen on 10/17 for ear pain/cerumen impaction.  Ear was irrigated - no infection noted per ov note.  Patient is still complaining of pain and clear, watery drainage from that ear.  He has not used any otc pain reliever.  Advised clear drainage likely related to recent irrigation.  Try Tylenol and/or ibuprofen prn discomfort.  Drainage should continue to dry.  Follow up if still no improvement or if new symptoms occur.  Patient agrees.

## 2015-03-26 NOTE — Telephone Encounter (Signed)
Thanks for letting me know!

## 2015-06-11 ENCOUNTER — Encounter: Payer: Self-pay | Admitting: Family Medicine

## 2015-06-11 ENCOUNTER — Ambulatory Visit (INDEPENDENT_AMBULATORY_CARE_PROVIDER_SITE_OTHER): Payer: BLUE CROSS/BLUE SHIELD | Admitting: Family Medicine

## 2015-06-11 VITALS — BP 124/82 | HR 80 | Temp 98.3°F | Ht 70.0 in | Wt 217.4 lb

## 2015-06-11 DIAGNOSIS — J988 Other specified respiratory disorders: Secondary | ICD-10-CM | POA: Diagnosis not present

## 2015-06-11 LAB — POCT RAPID STREP A (OFFICE): RAPID STREP A SCREEN: NEGATIVE

## 2015-06-11 MED ORDER — HYDROCOD POLST-CPM POLST ER 10-8 MG/5ML PO SUER: 5.0000 mL | Freq: Two times a day (BID) | ORAL | Status: DC | PRN

## 2015-06-11 MED ORDER — AMOXICILLIN-POT CLAVULANATE 875-125 MG PO TABS: 1.0000 | ORAL_TABLET | Freq: Two times a day (BID) | ORAL | Status: DC

## 2015-06-11 NOTE — Patient Instructions (Signed)
Use the cough medication as needed.  Fill the antibiotic if you fail to improve or worsen.  Take care  Dr. Adriana Simasook

## 2015-06-11 NOTE — Assessment & Plan Note (Signed)
New Problem. Rapid strep negative today. Treating cough with Tussionex. Advised that this is likely viral. Augmentin to be filled ONLY if he fails to improve or worsens.

## 2015-06-11 NOTE — Progress Notes (Signed)
Pre visit review using our clinic review tool, if applicable. No additional management support is needed unless otherwise documented below in the visit note. 

## 2015-06-11 NOTE — Progress Notes (Signed)
Subjective:  Patient ID: Christopher Ballard, male    DOB: 01/01/1961  Age: 55 y.o. MRN: [id number]  CC: Cough, runny nose, sore throat  HPI:  55 year old male presents with the above complaints.  Patient states he has not felt well since Saturday. He's been experiencing runny nose, sore throat, and more recently cough. Cough is currently the most troublesome symptom. Cough is mildly productive. He has been taking Theraflu with no improvement. No exacerbating factors. No associated fever, chills.  Social Hx   Social History   Social History  . Marital Status: Divorced    Spouse Name: N/A  . Number of Children: 3  . Years of Education: N/A   Occupational History  . Self-employed    Social History Main Topics  . Smoking status: Never Smoker   . Smokeless tobacco: Current User    Types: Chew  . Alcohol Use: Yes     Comment: 1 beer/night  . Drug Use: No  . Sexual Activity: Not Asked   Other Topics Concern  . 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  HENT: Positive for rhinorrhea and sore throat.   Respiratory: Positive for cough.     Objective:  BP 124/82 mmHg  Pulse 80  Temp(Src) 98.3 F (36.8 C) (Oral)  Ht 5\' 10"  (1.778 m)  Wt 217 lb 6 oz (98.601 kg)  BMI 31.19 kg/m2  SpO2 97%  BP/Weight 06/11/2015 03/19/2015 10/19/2014  Systolic BP 124 136 121  Diastolic BP 82 94 91  Wt. (Lbs) 217.38 217.5 207.25  BMI 31.19 31.21 29.74    Physical Exam  Constitutional: He appears well-developed. No distress.  HENT:  Head: Normocephalic and atraumatic.  Normal TM's bilaterally. Oropharynx with moderate erythema. No exudate.  Cardiovascular: Normal rate and regular rhythm.   Pulmonary/Chest: Effort normal and  breath sounds normal.  Neurological: He is alert.  Vitals reviewed.   Lab Results  Component Value Date   WBC 5.0 02/18/2011   HGB 15.5 10/19/2014   HCT 46.0 02/18/2011   PLT 204.0 02/18/2011   GLUCOSE 96 05/19/2012   CHOL 181 05/19/2012   TRIG 66.0 05/19/2012   HDL 49.80 05/19/2012   LDLDIRECT 136.5 05/12/2008   LDLCALC 118* 05/19/2012   ALT 30 02/18/2011   AST 24 02/18/2011   NA 139 05/19/2012   K 4.9 05/19/2012   CL 104 05/19/2012   CREATININE 0.9 05/19/2012   BUN 19 05/19/2012   CO2 30 05/19/2012   TSH 1.31 02/18/2011   PSA 1.06 05/19/2012    Assessment & Plan:   Problem List Items Addressed This Visit    Respiratory infection - Primary    New Problem. Rapid strep negative today. Treating cough with Tussionex. Advised that this is likely viral. Augmentin to be filled ONLY if he fails to improve or worsens.      Relevant Orders   POCT rapid strep A (Completed)   Culture, Group A Strep      Meds ordered this encounter  Medications  . amoxicillin-clavulanate (AUGMENTIN) 875-125 MG tablet    Sig: Take 1 tablet by mouth 2 (two) times daily.    Dispense:  14 tablet  Refill:  0  . chlorpheniramine-HYDROcodone (TUSSIONEX PENNKINETIC ER) 10-8 MG/5ML SUER    Sig: Take 5 mLs by mouth every 12 (twelve) hours as needed.    Dispense:  115 mL    Refill:  0    Follow-up: PRN  Everlene Other DO [facility]

## 2015-06-13 LAB — CULTURE, GROUP A STREP: ORGANISM ID, BACTERIA: NORMAL

## 2015-07-18 ENCOUNTER — Ambulatory Visit (INDEPENDENT_AMBULATORY_CARE_PROVIDER_SITE_OTHER): Payer: BLUE CROSS/BLUE SHIELD | Admitting: Family Medicine

## 2015-07-18 ENCOUNTER — Encounter: Payer: Self-pay | Admitting: Family Medicine

## 2015-07-18 VITALS — BP 124/84 | HR 73 | Temp 98.2°F | Wt 212.0 lb

## 2015-07-18 DIAGNOSIS — J0181 Other acute recurrent sinusitis: Secondary | ICD-10-CM | POA: Diagnosis not present

## 2015-07-18 MED ORDER — HYDROCOD POLST-CPM POLST ER 10-8 MG/5ML PO SUER: 5.0000 mL | Freq: Two times a day (BID) | ORAL | Status: DC | PRN

## 2015-07-18 MED ORDER — AMOXICILLIN-POT CLAVULANATE 875-125 MG PO TABS: 1.0000 | ORAL_TABLET | Freq: Two times a day (BID) | ORAL | Status: DC

## 2015-07-18 NOTE — Progress Notes (Signed)
.  Patient ID: Christopher Ballard, male   DOB: 03/13/1961, 55 y.o.   MRN: [id number] SUBJECTIVE:  Christopher Ballard is a 55 y.o. male pt of Dr. Reece Agar, new to me, who complains of coryza, congestion, cough and bilateral ear pressure for 20 days. He denies a history of anorexia and chest pain and denies a history of asthma. Patient denies smoke cigarettes.   No current outpatient prescriptions on file prior to visit.   No current facility-administered medications on file prior to visit.    No Known Allergies  Past Medical History  Diagnosis Date  . GERD (gastroesophageal reflux disease)   . Impingement syndrome of left shoulder region 10/2014  . 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 10/2014  . Bicipital tendinitis of left shoulder 10/2014  . Dental crowns present     Past Surgical History  Procedure Laterality Date  . Wisdom tooth extraction  1987  . Colonoscopy with propofol  07/23/2012  . Bicept tenodesis Left 10/19/2014    Procedure: BICEPS TENODESIS;  Surgeon: Mckinley Jewel, MD;  Location: [facility];  Service: Orthopedics;  Laterality: Left;    Family History  Problem Relation Age of Onset  . Seizures Father     Social History   Social History  . Marital Status: Divorced    Spouse Name: N/A  . Number of Children: 3  . Years of Education: N/A   Occupational History  . Self-employed    Social History Main Topics  . Smoking status: Never Smoker   . Smokeless tobacco: Current User    Types: Chew  . Alcohol Use: 0.0 oz/week    0 Standard drinks or equivalent per week     Comment: 1 beer/night  . Drug Use: No  . Sexual Activity: Not on file   Other Topics Concern  . Not on file   Social History Narrative   Caffeine: 1 cup coffee/day; 1 drink/day   Lives alone   Married   3 grown children   3 dogs   1 cat   Occupation: self employed (septic system)   Activity: T25   Diet: good water, fruits/vegetables daily    OBJECTIVE: BP 124/84 mmHg   Pulse 73  Temp(Src) 98.2 F (36.8 C) (Oral)  Wt 212 lb (96.163 kg)  SpO2 97%  He appears well, vital signs are as noted. Ears normal.  Throat and pharynx normal.  Neck supple. No adenopathy in the neck. Nose is congested. Sinuses tender throughout. The chest is clear, without wheezes or rales.  ASSESSMENT:  sinusitis  PLAN: Given duration and progression of symptoms, will treat for bacterial sinusitis.  Symptomatic therapy suggested: push fluids, rest and return office visit prn if symptoms persist or worsen.  Call or return to clinic prn if these symptoms worsen or fail to improve as anticipated.

## 2015-07-18 NOTE — Progress Notes (Signed)
Pre visit review using our clinic review tool, if applicable. No additional management support is needed unless otherwise documented below in the visit note. 

## 2015-07-18 NOTE — Patient Instructions (Signed)
Take antibiotic as directed.  Drink lots of fluids.    Treat sympotmatically with Mucinex, nasal saline irrigation, and Tylenol/Ibuprofen.   Also try an antihistamine/decongestant like claritin D or zyrtec D over the counter- two times a day as needed ( have to sign for them at pharmacy).   Try over the counter nasocort-start with 2 sprays per nostril per day...and then try to taper to 1 spray per nostril once symptoms improve.   You can use warm compresses.  Cough suppressant at night.   Call if not improving as expected in 5-7 days.    

## 2016-05-27 DIAGNOSIS — Z23 Encounter for immunization: Secondary | ICD-10-CM | POA: Diagnosis not present

## 2016-12-02 DIAGNOSIS — M25562 Pain in left knee: Secondary | ICD-10-CM | POA: Diagnosis not present

## 2016-12-02 DIAGNOSIS — M25561 Pain in right knee: Secondary | ICD-10-CM | POA: Diagnosis not present

## 2016-12-06 DIAGNOSIS — M25561 Pain in right knee: Secondary | ICD-10-CM | POA: Diagnosis not present

## 2016-12-06 DIAGNOSIS — M25562 Pain in left knee: Secondary | ICD-10-CM | POA: Diagnosis not present

## 2016-12-09 DIAGNOSIS — M25561 Pain in right knee: Secondary | ICD-10-CM | POA: Diagnosis not present

## 2016-12-15 DIAGNOSIS — S83281A Other tear of lateral meniscus, current injury, right knee, initial encounter: Secondary | ICD-10-CM | POA: Diagnosis not present

## 2016-12-15 DIAGNOSIS — G8918 Other acute postprocedural pain: Secondary | ICD-10-CM | POA: Diagnosis not present

## 2016-12-15 DIAGNOSIS — S83241A Other tear of medial meniscus, current injury, right knee, initial encounter: Secondary | ICD-10-CM | POA: Diagnosis not present

## 2016-12-15 DIAGNOSIS — M659 Synovitis and tenosynovitis, unspecified: Secondary | ICD-10-CM | POA: Diagnosis not present

## 2016-12-23 DIAGNOSIS — S83241D Other tear of medial meniscus, current injury, right knee, subsequent encounter: Secondary | ICD-10-CM | POA: Diagnosis not present

## 2016-12-23 DIAGNOSIS — S83281D Other tear of lateral meniscus, current injury, right knee, subsequent encounter: Secondary | ICD-10-CM | POA: Diagnosis not present

## 2017-01-13 DIAGNOSIS — S83281D Other tear of lateral meniscus, current injury, right knee, subsequent encounter: Secondary | ICD-10-CM | POA: Diagnosis not present

## 2017-01-13 DIAGNOSIS — S83241D Other tear of medial meniscus, current injury, right knee, subsequent encounter: Secondary | ICD-10-CM | POA: Diagnosis not present

## 2017-06-30 ENCOUNTER — Ambulatory Visit: Payer: Self-pay

## 2017-06-30 NOTE — Telephone Encounter (Signed)
Pt calling with c/o onset of chest pain and left arm "aching" starting 3 weeks ago. States the pain comes and goes. Reports pain is a 3/10. States when he has chest pain that it lasts "several hours." Disposition based on sx recommends go to Ed now (notify PCP). Pt refusing to go to ED wanting an OV instead. Advised [facility] but still refusing to go. Called [facility] office and [place] [facility]. [facility] and transferred [facility] to [facility] to discuss with pt.  Reason for Disposition . Pain also present in shoulder(s) or arm(s) or jaw  (Exception: pain is clearly made worse by movement)  Answer Assessment - Initial Assessment Questions 1. LOCATION: "Where does it hurt?"       Center of chest, left arm aching intermittently 2. RADIATION: "Does the pain go anywhere else?" (e.g., into neck, jaw, arms, back)     Left arm 3. ONSET: "When did the chest pain begin?" (Minutes, hours or days)      3 weeks ago 4. PATTERN "Does the pain come and go, or has it been constant since it started?"  "Does it get worse with exertion?"      Comes and goes 5. DURATION: "How long does it last" (e.g., seconds, minutes, hours)     Several hours 6. SEVERITY: "How bad is the pain?"  (e.g., Scale 1-10; mild, moderate, or severe)    - MILD (1-3): doesn't interfere with normal activities     - MODERATE (4-7): interferes with normal activities or awakens from sleep    - SEVERE (8-10): excruciating pain, unable to do any normal activities       Mild 2-3 out of 10 7. CARDIAC RISK FACTORS: "Do you have any history of heart problems or risk factors for heart disease?" (e.g., prior heart attack, angina; high blood pressure, diabetes, being overweight, high cholesterol, smoking, or strong family history of heart disease)     Slightly overweight 8. PULMONARY RISK FACTORS: "Do you have any history of lung disease?"  (e.g., blood clots in lung, asthma, emphysema, birth control pills)     no 9. CAUSE: "What do  you think is causing the chest pain?"     initially thought it was due to slipping in the ice and thought it was muscle pull 10. OTHER SYMPTOMS: "Do you have any other symptoms?" (e.g., dizziness, nausea, vomiting, sweating, fever, difficulty breathing, cough)       None of the above 11. PREGNANCY: "Is there any chance you are pregnant?" "When was your last menstrual period?"       n/a  Protocols used: CHEST PAIN-A-AH

## 2017-06-30 NOTE — Telephone Encounter (Signed)
I spoke with pt starting 2 - 3 wks ago pt has had intermittent CP or tightness.No N or V or SOB or dizziness. Pt is on no medications. Pt slipped on ice and thought had pulled muscle in neck and shoulder but that is better. Pt refusing to go to UC or ED; I spoke with Dr Reece AgarG and pt can schedule 30' appt on 07/01/17 and if condition worsens prior to appt pt to go to ED. Pt voiced understanding and scheduled  30' appt with Harlin Heys Gessner FNP on 07/01/17 at 12 noon. FYI to Harlin Heys Gessner FNP.

## 2017-07-01 ENCOUNTER — Encounter: Payer: Self-pay | Admitting: Family Medicine

## 2017-07-01 ENCOUNTER — Ambulatory Visit: Payer: BLUE CROSS/BLUE SHIELD | Admitting: Family Medicine

## 2017-07-01 VITALS — BP 150/90 | HR 82 | Temp 98.4°F | Wt 221.4 lb

## 2017-07-01 DIAGNOSIS — Z23 Encounter for immunization: Secondary | ICD-10-CM | POA: Diagnosis not present

## 2017-07-01 DIAGNOSIS — R0789 Other chest pain: Secondary | ICD-10-CM | POA: Diagnosis not present

## 2017-07-01 NOTE — Patient Instructions (Signed)
Please start a daily aspirin 81 mg  You should get a call in the next day or two about an appointment with cardiology  If your symptoms worsen or you develop shortness of breath, sweating- go to ER

## 2017-07-01 NOTE — Progress Notes (Signed)
Subjective:    Patient ID: Christopher Ballard, male    DOB: August 02, 1960, 57 y.o.   MRN: [id number]  HPI This is a 57 yo male who presents today with 3-4 weeks of chest "tightness", sometimes accompanied by ache in left arm, occasional pain in right arm.  Pain is tightness, occurring every day, lasts about an hour or more. Not sure how many episodes a day he is having. Off and on. On 0-10 scale, it is a 2. Never awakens him from sleep. 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 Has at rest. No SOB. Not always accompanied by arm pain. No nausea, no diaphoresis. No palpitations. No pedal edema. No wheezing, occasional cough, "keeps a cold all winter." No smoking history. Does not take any medications.   No family history of heart disease.     Past Medical History:  Diagnosis Date  . 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 10/2014  . Bicipital tendinitis of left shoulder 10/2014  . Dental crowns present   . GERD (gastroesophageal reflux disease)   . Impingement syndrome of left shoulder region 10/2014   Past Surgical History:  Procedure Laterality Date  . BICEPT TENODESIS Left 10/19/2014   Procedure: BICEPS TENODESIS;  Surgeon: Mckinley Jewel, MD;  Location: [facility];  Service: Orthopedics;  Laterality: Left;  . COLONOSCOPY WITH PROPOFOL  07/23/2012  . WISDOM TOOTH EXTRACTION  1987   Family History  Problem Relation Age of Onset  . Seizures Father    Social History   Tobacco Use  . Smoking status: Never Smoker  . Smokeless tobacco: Current User    Types: Chew  Substance Use Topics  . Alcohol use: Yes    Alcohol/week: 0.0 oz    Comment: 1 beer/night  . Drug use: No      Review of Systems  Constitutional: Negative for diaphoresis.  Respiratory: Positive for cough (occasional, dry) and chest tightness. Negative for shortness of breath and wheezing.   Cardiovascular: Negative for palpitations and leg swelling.  Gastrointestinal: Negative  for nausea and vomiting.       Objective:   Physical Exam  Constitutional: He is oriented to person, place, and time. He appears well-developed and well-nourished.  HENT:  Head: Normocephalic and atraumatic.  Cardiovascular: Normal rate, regular rhythm and normal heart sounds.  Pulmonary/Chest: Effort normal and breath sounds normal.  Neurological: He is alert and oriented to person, place, and time.  Skin: Skin is warm and dry. He is not diaphoretic.  Psychiatric: He has a normal mood and affect. His behavior is normal. Judgment and thought content normal.  Vitals reviewed.        BP (!) 150/90 (BP Location: Left Arm, Patient Position: Sitting, Cuff Size: Normal)   Pulse 82   Temp 98.4 F (36.9 C) (Oral)   Wt 221 lb 6.4 oz (100.4 kg)   SpO2 96%   BMI 31.77 kg/m  Wt Readings from Last 3 Encounters:  07/01/17 221 lb 6.4 oz (100.4 kg)  07/18/15 212 lb (96.2 kg)  06/11/15 217 lb 6 oz (98.6 kg)   BP Readings from Last 3 Encounters:  07/01/17 (!) 150/90  07/18/15 124/84  06/11/15 124/82   EKG- NSR, rate 80   Assessment & Plan:  Discussed with Dr. Sharen Hones 1. Chest tightness or pressure - Have advised him to start aspirin 81 mg daily - RTC/ER precautions reviewed - EKG 12-Lead - Ambulatory referral to Cardiology  2. Need for influenza vaccination - Flu Vaccine  QUAD 6+ mos PF IM (Fluarix Quad PF)  - follow up as scheduled with Dr. Sharen HonesGutierrez next month  Christopher Reeeborah Gypsy Kellogg, FNP-BC  [facility]  07/01/2017 12:38 PM

## 2017-07-05 ENCOUNTER — Encounter (HOSPITAL_COMMUNITY): Payer: Self-pay

## 2017-07-05 ENCOUNTER — Emergency Department (HOSPITAL_COMMUNITY): Payer: BLUE CROSS/BLUE SHIELD

## 2017-07-05 ENCOUNTER — Other Ambulatory Visit: Payer: Self-pay

## 2017-07-05 ENCOUNTER — Emergency Department (HOSPITAL_COMMUNITY)
Admission: EM | Admit: 2017-07-05 | Discharge: 2017-07-05 | Disposition: A | Discharge status: Home / Self Care | Payer: BLUE CROSS/BLUE SHIELD | Attending: Emergency Medicine | Admitting: Emergency Medicine

## 2017-07-05 DIAGNOSIS — F17220 Nicotine dependence, chewing tobacco, uncomplicated: Secondary | ICD-10-CM | POA: Insufficient documentation

## 2017-07-05 DIAGNOSIS — R0789 Other chest pain: Secondary | ICD-10-CM | POA: Diagnosis not present

## 2017-07-05 DIAGNOSIS — R079 Chest pain, unspecified: Secondary | ICD-10-CM | POA: Diagnosis not present

## 2017-07-05 DIAGNOSIS — M79602 Pain in left arm: Secondary | ICD-10-CM | POA: Diagnosis not present

## 2017-07-05 DIAGNOSIS — R110 Nausea: Secondary | ICD-10-CM | POA: Insufficient documentation

## 2017-07-05 LAB — I-STAT TROPONIN, ED
Troponin i, poc: 0 ng/mL (ref 0.00–0.08)
Troponin i, poc: 0 ng/mL (ref 0.00–0.08)

## 2017-07-05 LAB — CBC
HCT: 47.2 % (ref 39.0–52.0)
Hemoglobin: 16.5 g/dL (ref 13.0–17.0)
MCH: 32.5 pg (ref 26.0–34.0)
MCHC: 35 g/dL (ref 30.0–36.0)
MCV: 93.1 fL (ref 78.0–100.0)
Platelets: 205 10*3/uL (ref 150–400)
RBC: 5.07 MIL/uL (ref 4.22–5.81)
RDW: 12.8 % (ref 11.5–15.5)
WBC: 5.1 10*3/uL (ref 4.0–10.5)

## 2017-07-05 LAB — BASIC METABOLIC PANEL
Anion gap: 11 (ref 5–15)
BUN: 20 mg/dL (ref 6–20)
CO2: 26 mmol/L (ref 22–32)
Calcium: 9.7 mg/dL (ref 8.9–10.3)
Chloride: 102 mmol/L (ref 101–111)
Creatinine, Ser: 0.99 mg/dL (ref 0.61–1.24)
GFR calc Af Amer: >60 mL/min (ref >60)
Glucose, Bld: 107 mg/dL — ABNORMAL HIGH (ref 65–99)
Potassium: 4.1 mmol/L (ref 3.5–5.1)
Sodium: 139 mmol/L (ref 135–145)

## 2017-07-05 NOTE — ED Triage Notes (Signed)
Patient complains of 2 weeks of intermittent chest pain. Has seen primary MD for same and has cardiology appt tomorrow. On arrival reports the discomfort as mild but kept him up all night. Alert and oriented

## 2017-07-05 NOTE — ED Provider Notes (Signed)
[facility] EMERGENCY DEPARTMENT Provider Note   CSN: [id number] Arrival date & time: 07/05/17  0865     History   Chief Complaint No chief complaint on file.   HPI Christopher Ballard is a 57 y.o. male with a history of GERD who presents emerged department today for intermittent chest pains over the last 1 month.  She notes that over the last 3-4 weeks he has had intermittent bouts of chest "tightness" accompanied by achiness of the left arm and occasionally the right arm.  The pain occurs every day and is always at rest.  It commonly occurs in the evening time when he is driving home from work.  Pain typically lasts for a few minutes to several hours is rated as a 2/10.  The pain is nonexertional, nonpleuritic and non-positional.  Patient works [employer] representative and states that activity does not bring on his symptoms.  There is no accompanied shortness of breath, nausea, diaphoresis with this.  He was given an ambulatory referral to cardiology by his PCP and has an appointment tomorrow.  He is chest pain-free currently.  He notes his last symptoms began yesterday evening around 10 PM while the patient was watching TV and started right arm, before going across his chest and into his left arm.  He presented today because he did have some associated nausea with this episiode which was new.  No emesis, shortness of breath or diaphoresis.  Pain was nonexertional, nonpleuritic and non-positional.  The symptoms resolved this morning at approximately 3-4 AM.  Patient is currently asymptomatic.  Patient is never smoker.  No family history of cardiac disease.  HPI  Past Medical History:  Diagnosis Date  . 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 10/2014  . Bicipital tendinitis of left shoulder 10/2014  . Dental crowns present   . GERD (gastroesophageal reflux disease)   . Impingement syndrome of left shoulder region 10/2014    Patient Active Problem List   Diagnosis Date Noted    . Respiratory infection 06/11/2015  . Cerumen impaction 03/19/2015  . Healthcare maintenance 05/19/2012  . ED (erectile dysfunction) 05/19/2012  . Lightheadedness 02/18/2011  . ACROCHORDON 05/06/2010  . NUMBNESS, ARM 05/12/2008    Past Surgical History:  Procedure Laterality Date  . BICEPT TENODESIS Left 10/19/2014   Procedure: BICEPS TENODESIS;  Surgeon: Mckinley Jewel, MD;  Location: [facility];  Service: Orthopedics;  Laterality: Left;  . COLONOSCOPY WITH PROPOFOL  07/23/2012  . WISDOM TOOTH EXTRACTION  1987       Home Medications    Prior to Admission medications   Not on File    Family History Family History  Problem Relation Age of Onset  . Seizures Father     Social History Social History   Tobacco Use  . Smoking status: Never Smoker  . Smokeless tobacco: Current User    Types: Chew  Substance Use Topics  . Alcohol use: Yes    Alcohol/week: 0.0 oz    Comment: 1 beer/night  . Drug use: No     Allergies   Patient has no known allergies.   Review of Systems Review of Systems  All other systems reviewed and are negative.    Physical Exam Updated Vital Signs BP 115/86   Pulse (!) 58   Temp 97.9 F (36.6 C)   Resp 16   SpO2 100%   Physical Exam  Constitutional: He appears well-developed and well-nourished.  HENT:  Head: Normocephalic and atraumatic.  Right Ear: External ear normal.  Left Ear: External ear normal.  Nose: Nose normal.  Mouth/Throat: Uvula is midline, oropharynx is clear and moist and mucous membranes are normal. No tonsillar exudate.  Eyes: Pupils are equal, round, and reactive to light. Right eye exhibits no discharge. Left eye exhibits no discharge. No scleral icterus.  Neck: Trachea normal. Neck supple. No JVD present. No spinous process tenderness present. Carotid bruit is not present. No neck rigidity. Normal range of motion present.  Cardiovascular: Normal rate, regular rhythm and intact distal pulses.   No murmur heard. Pulses:      Radial pulses are 2+ on the right side, and 2+ on the left side.       Dorsalis pedis pulses are 2+ on the right side, and 2+ on the left side.       Posterior tibial pulses are 2+ on the right side, and 2+ on the left side.  No lower extremity swelling or edema. Calves symmetric in size bilaterally.  Pulmonary/Chest: Effort normal and breath sounds normal. He exhibits no tenderness.  Abdominal: Soft. Bowel sounds are normal. There is no tenderness. There is no rebound and no guarding.  Musculoskeletal: He exhibits no edema.  Lymphadenopathy:    He has no cervical adenopathy.  Neurological: He is alert.  Speech clear. Follows commands. No facial droop. PERRLA. EOM grossly intact. CN III-XII grossly intact. Grossly moves all extremities 4 without ataxia. Able and appropriate strength for age to upper and lower extremities bilaterally including grip strength and dorsiflexion/plantar flexion. Normal finger to nose.   Skin: Skin is warm and dry. No rash noted. He is not diaphoretic.  Psychiatric: He has a normal mood and affect.  Nursing note and vitals reviewed.    ED Treatments / Results  Labs (all labs ordered are listed, but only abnormal results are displayed) Labs Reviewed  BASIC METABOLIC PANEL - Abnormal; Notable for the following components:      Result Value   Glucose, Bld 107 (*)    All other components within normal limits  CBC  I-STAT TROPONIN, ED  I-STAT TROPONIN, ED    EKG  EKG Interpretation  Date/Time:  Sunday July 05 2017 08:21:08 EST Ventricular Rate:  76 PR Interval:  166 QRS Duration: 96 QT Interval:  392 QTC Calculation: 441 R Axis:   36 Text Interpretation:  Normal sinus rhythm Incomplete right bundle branch block Cannot rule out Anterior infarct , age undetermined Abnormal ECG Confirmed by Rolland PorterJames, Mark ([id number]) on 07/05/2017 12:02:44 PM       Radiology Dg Chest 2 View  Result Date: 07/05/2017 CLINICAL DATA:  Chest  pain radiating to left arm x3 weeks EXAM: CHEST  2 VIEW COMPARISON:  None. FINDINGS: Lungs are clear.  No pleural effusion or pneumothorax. The heart is normal in size. Visualized osseous structures are within normal limits. IMPRESSION: Normal chest radiographs. Electronically Signed   By: Charline BillsSriyesh  Krishnan M.D.   On: 07/05/2017 08:56    Procedures Procedures (including critical care time)  Medications Ordered in ED Medications - No data to display   Initial Impression / Assessment and Plan / ED Course  I have reviewed the triage vital signs and the nursing notes.  Pertinent labs & imaging results that were available during my care of the patient were reviewed by me and considered in my medical decision making (see chart for details).     Patient presented with chest pain to the ED. Patient is to be discharged with recommendation  to follow up with cardiologist tomorrow for scheduled appointment in regards to today's hospital visit. Chest pain is not likely of cardiac etiology due to presentation, stable vital signs, no tracheal deviation, no JVD or new murmur, RRR, breath sounds equal bilaterally, reassuring EKG, negative troponin x 2, and negative CXR. HEART score is 2. Patient has been advised to return to the ED if chest pain becomes exertional, associated with diaphoresis or worsens or becomes concerning in any way. Patient appears reliable for follow up and is agreeable to discharge. I advised the patient to follow-up with their primary care provider this week. I advised the patient to return to the emergency department with new or worsening symptoms or new concerns. The patient verbalized understanding and agreement with plan. Patient stable   This patient was discussed with Dr. Fayrene Fearing who is in agreement with assessment and plan.   Final Clinical Impressions(s) / ED Diagnoses   Final diagnoses:  Atypical chest pain    ED Discharge Orders    None       Princella Pellegrini 07/05/17 1248    Rolland Porter, MD 07/06/17 2153

## 2017-07-05 NOTE — ED Notes (Signed)
Patient not in room

## 2017-07-05 NOTE — Discharge Instructions (Signed)
Read instructions below for reasons to return to the Emergency Department.  Please follow-up with your cardiologist tomorrow.  Also follow up with your PCP. If you do not have a doctor, use the resource guide listed below to help you find one.   Tests performed today include: An EKG of your heart A chest x-ray Cardiac enzymes - a blood test for heart muscle damage (x2) Blood counts and electrolytes Vital signs. See below for your results today.   Chest Pain (Nonspecific)  HOME CARE INSTRUCTIONS  For the next few days, avoid physical activities that bring on chest pain. Continue physical activities as directed.  Do not smoke cigarettes or drink alcohol until your symptoms are gone. If you do smoke, it is time to quit. You may receive instructions and counseling on how to stop smoking. Only take over-the-counter or prescription medicine for pain, discomfort, or fever as directed by your caregiver.  Follow your caregiver's suggestions for further testing if your chest pain does not go away.  Keep any follow-up appointments you made. If you do not go to an appointment, you could develop lasting (chronic) problems with pain. If there is any problem keeping an appointment, you must call to reschedule.  SEEK MEDICAL CARE IF:  You think you are having problems from the medicine you are taking. Read your medicine instructions carefully.  Your chest pain does not go away, even after treatment.  You develop a rash with blisters on your chest.  SEEK IMMEDIATE MEDICAL CARE IF:  You have increased chest pain or pain that spreads to your arm, neck, jaw, back, or belly (abdomen).  You develop shortness of breath, an increasing cough, or you are coughing up blood.  You have severe back or abdominal pain, feel sick to your stomach (nauseous) or throw up (vomit).  You develop severe weakness, fainting, or chills.  You have an oral temperature above 102 F (38.9 C), not controlled by medicine.  THIS IS AN  EMERGENCY. Do not wait to see if the pain will go away. Get medical help at once. Call your local emergency services (911 in [place] Do not drive yourself to the hospital. Additional Information:  Your vital signs today were: BP 119/87    Pulse (!) 55    Temp 97.9 F (36.6 C)    Resp 16    SpO2 99%  If your blood pressure (BP) was elevated above 135/85 this visit, please have this repeated by your doctor within one month. ---------------

## 2017-07-05 NOTE — ED Notes (Signed)
Provider at bedside

## 2017-07-05 NOTE — ED Notes (Signed)
Patient in room

## 2017-07-06 ENCOUNTER — Encounter: Payer: Self-pay | Admitting: Internal Medicine

## 2017-07-06 ENCOUNTER — Ambulatory Visit: Payer: BLUE CROSS/BLUE SHIELD | Admitting: Internal Medicine

## 2017-07-06 DIAGNOSIS — R0789 Other chest pain: Secondary | ICD-10-CM | POA: Diagnosis not present

## 2017-07-06 MED ORDER — ASPIRIN EC 81 MG PO TBEC: 81.0000 mg | DELAYED_RELEASE_TABLET | Freq: Every day | ORAL | 3 refills | Status: DC

## 2017-07-06 MED ORDER — RANITIDINE HCL 150 MG PO TABS: 150.0000 mg | ORAL_TABLET | Freq: Two times a day (BID) | ORAL | 3 refills | Status: DC

## 2017-07-06 NOTE — Patient Instructions (Addendum)
Medication Instructions:  START Aspirin 81 mg daily  START Zantac 150 mg by mouth twice a day   -- If you need a refill on your cardiac medications before your next appointment, please call your pharmacy. --  Labwork: None ordered  Testing/Procedures: Your physician has requested that you have a lexiscan myoview. For further information please visit [email or web address]. Please follow instruction sheet, as given.  PLEASE Schedule to be done within 1 week   Follow-Up: Your physician wants you to follow-up in: 1 month with Dr. Okey DupreEnd.     Thank you for choosing [facility]!!    Any Other Special Instructions Will Be Listed Below (If Applicable).

## 2017-07-06 NOTE — Progress Notes (Signed)
New Outpatient Visit Date: 07/06/2017  Referring Provider: Emi Belfast, FNP [address] [place], [place] [address]  Chief Complaint: Chest pain  HPI:  Christopher Ballard is a 57 y.o. male who is being seen today for the evaluation of chest pain at the request of Ms. Leone Payor. He has a history of GERD. He presented to [facility] last week (07/01/17), complaining of a 3-4 week history of "chest tightness." It was not exertional but occurred daily at rest.  EKG was unremarkable.  He subsequently went to the [facility] ED yesterday due to recurrent pain, this time with accompanying nausea and mild shortness of breath.  Workup was notable for negative troponin I x 2 and EKG demonstrating NSR with incomplete RBBB, and poor R-wave progression.  Today, Christopher Ballard feels well.  He reports that his chest pain began around the time that he was visiting [place].  He had slipped on snow and caught himself awkwardly.  He had a stiff neck as well as soreness across his shoulders and chest for several days.  The neck discomfort has since resolved, though he continues to have episodic chest pain.  He describes it as a tightness across the center of his chest extending to both arms.  It comes on at any time and is not related to exertion.  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  The episodes last from 30 minutes to several hours with a maximal intensity of 3/10.  Yesterday, he took naproxen with prompt relief of the pain.  He has also had acid reflux in the past but notes that the discomfort at that time was different.  Christopher Ballard denies palpitations, lightheadedness, orthopnea, PND, and edema.  He has never undergone previous cardiovascular testing.  --------------------------------------------------------------------------------------------------  Cardiovascular History & Procedures: Cardiovascular Problems:  Chest pain  Risk  Factors:  Male gender, smokeless tobacco use, and age > 67  Cath/PCI:  None  CV Surgery:  None  EP Procedures and Devices:  None  Non-Invasive Evaluation(s):  None  Recent CV Pertinent Labs: Lab Results  Component Value Date   CHOL 181 05/19/2012   HDL 49.80 05/19/2012   LDLCALC 118 (H) 05/19/2012   LDLDIRECT 136.5 05/12/2008   TRIG 66.0 05/19/2012   CHOLHDL 4 05/19/2012   K 4.1 07/05/2017   BUN 20 07/05/2017   CREATININE 0.99 07/05/2017    --------------------------------------------------------------------------------------------------  Past Medical History:  Diagnosis Date  . 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 10/2014  . Bicipital tendinitis of left shoulder 10/2014  . Dental crowns present   . GERD (gastroesophageal reflux disease)   . Impingement syndrome of left shoulder region 10/2014    Past Surgical History:  Procedure Laterality Date  . BICEPT TENODESIS Left 10/19/2014   Procedure: BICEPS TENODESIS;  Surgeon: Mckinley Jewel, MD;  Location: [facility];  Service: Orthopedics;  Laterality: Left;  . COLONOSCOPY WITH PROPOFOL  07/23/2012  . WISDOM TOOTH EXTRACTION  1987    Medications: None (occasionally takes naproxen and aspirin).  Allergies: Patient has no known allergies.  Social History   Socioeconomic History  . Marital status: Divorced    Spouse name: Not on file  . Number of children: 3  . Years of education: Not on file  . Highest education level: Not on file  Social Needs  . Financial resource strain: Not on file  . Food insecurity - worry:  Not on file  . Food insecurity - inability: Not on file  . Transportation needs - medical: Not on file  . Transportation needs - non-medical: Not on file  Occupational History  . Occupation: Self-employed  Tobacco Use  . Smoking status: Never Smoker  . Smokeless tobacco: Current User    Types: Chew  Substance and Sexual Activity  . Alcohol use: Yes     Alcohol/week: 3.0 oz    Types: 5 Cans of beer per week  . Drug use: No  . Sexual activity: Not on file  Other Topics Concern  . 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 Seizures Father   . Heart disease Neg Hx     Review of Systems: A 12-system review of systems was performed and was negative except as noted in the HPI.  --------------------------------------------------------------------------------------------------  Physical Exam: BP 114/76   Pulse (!) 104   Ht 5\' 10"  (1.778 m)   Wt 217 lb 8 oz (98.7 kg)   BMI 31.21 kg/m   General: Overweight man, seated comfortably in the exam room. HEENT: No conjunctival pallor or scleral icterus. Moist mucous membranes. OP clear. Neck: Supple without lymphadenopathy, thyromegaly, JVD, or HJR. No carotid bruit. Lungs: Normal work of breathing. Clear to auscultation bilaterally without wheezes or crackles. Heart: Regular rate and rhythm without murmurs, rubs, or gallops. Non-displaced PMI. Abd: Bowel sounds present. Soft, NT/ND without hepatosplenomegaly Ext: No lower extremity edema. Radial, PT, and DP pulses are 2+ bilaterally Skin: Warm and dry without rash. Neuro: CNIII-XII intact. Strength and fine-touch sensation intact in upper and lower extr normal sinus rhythm with emities bilaterally. Psych: Normal mood and affect.  EKG: Normal sinus rhythm with poor R wave progression in V2 and V3, potentially due to lead placement.  Otherwise, no significant abnormalities.  Lab Results  Component Value Date   WBC 5.1 07/05/2017   HGB 16.5 07/05/2017   HCT 47.2 07/05/2017   MCV 93.1 07/05/2017   PLT 205 07/05/2017    Lab Results  Component Value Date   NA 139 07/05/2017   K  4.1 07/05/2017   CL 102 07/05/2017   CO2 26 07/05/2017   BUN 20 07/05/2017   CREATININE 0.99 07/05/2017   GLUCOSE 107 (H) 07/05/2017   ALT 30 02/18/2011    Lab Results  Component Value Date   CHOL 181 05/19/2012   HDL 49.80 05/19/2012   LDLCALC 118 (H) 05/19/2012   LDLDIRECT 136.5 05/12/2008   TRIG 66.0 05/19/2012   CHOLHDL 4 05/19/2012    --------------------------------------------------------------------------------------------------  ASSESSMENT AND PLAN: Atypical chest pain Pain has both typical and atypical features.  Given that the discomfort is nonexertional and began after Christopher Ballard had to move awkwardly after slipping on snow, musculoskeletal etiology is a likely cause.  GERD is also a consideration.  However, we have agreed that coronary insufficiency must be excluded.  Cardiac risk factors include age, male gender, and smokeless tobacco use.  We have discussed stress testing versus cardiac catheterization and have agreed to begin with a pharmacologic myocardial perfusion stress test.  Due to knee problems, Christopher Ballard is unsure if he would be able to exercise  adequately on a treadmill.  I have asked him to begin taking aspirin 81 mg daily as well as ranitidine 150 mg twice daily.  If he has recurrent chest pain that does not resolve promptly, he should return to the emergency department for further evaluation.  We will also obtain a fasting lipid panel and ALT for further risk stratification when he returns for the stress test.  Follow-up: Return to clinic in 1 month.  Yvonne Kendall, MD 07/06/2017 7:52 PM

## 2017-07-07 ENCOUNTER — Telehealth (HOSPITAL_COMMUNITY): Payer: Self-pay | Admitting: *Deleted

## 2017-07-07 NOTE — Telephone Encounter (Signed)
Patient given detailed instructions per Myocardial Perfusion Study Information Sheet for the test on 07/10/17. Patient notified to arrive 15 minutes early and that it is imperative to arrive on time for appointment to keep from having the test rescheduled.  If you need to cancel or reschedule your appointment, please call the office within 24 hours of your appointment. . Patient verbalized understanding. Christopher Ballard [name]    

## 2017-07-10 ENCOUNTER — Ambulatory Visit (HOSPITAL_COMMUNITY): Payer: BLUE CROSS/BLUE SHIELD | Attending: Cardiovascular Disease

## 2017-07-10 ENCOUNTER — Telehealth: Payer: Self-pay

## 2017-07-10 DIAGNOSIS — I4510 Unspecified right bundle-branch block: Secondary | ICD-10-CM | POA: Insufficient documentation

## 2017-07-10 DIAGNOSIS — R079 Chest pain, unspecified: Secondary | ICD-10-CM | POA: Insufficient documentation

## 2017-07-10 DIAGNOSIS — I2510 Atherosclerotic heart disease of native coronary artery without angina pectoris: Secondary | ICD-10-CM | POA: Diagnosis not present

## 2017-07-10 DIAGNOSIS — R0789 Other chest pain: Secondary | ICD-10-CM | POA: Diagnosis not present

## 2017-07-10 LAB — MYOCARDIAL PERFUSION IMAGING
CHL CUP NUCLEAR SDS: 2
LHR: 0.18
LVDIAVOL: 115 mL (ref 62–150)
LVSYSVOL: 61 mL
Peak HR: 91 {beats}/min
Rest HR: 55 {beats}/min
SRS: 3
SSS: 5
TID: 1.06

## 2017-07-10 IMAGING — NM NM MISC PROCEDURE
6 series · 36 of 36 positions shown · non-contrast
Comparison: none

[Series 1: wbr_s-proj_st stress-gsp · 6.40mm/px · 6 of 512 frames shown]
[frame 43/512]
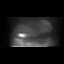
[frame 128/512]
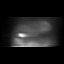
[frame 214/512]
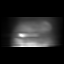
[frame 299/512]
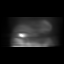
[frame 384/512]
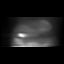
[frame 470/512]
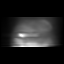

[Series 1: stress-sum-em · 6.40mm/px · 6 of 64 frames shown]
[frame 6/64]
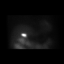
[frame 16/64]
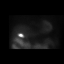
[frame 27/64]
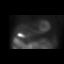
[frame 38/64]
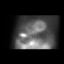
[frame 48/64]
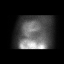
[frame 59/64]
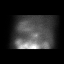

[Series 1: wbr_s-proj_st stress-sum-em · 6.40mm/px · 6 of 64 frames shown]
[frame 6/64]
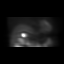
[frame 16/64]
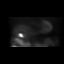
[frame 27/64]
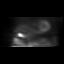
[frame 38/64]
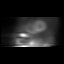
[frame 48/64]
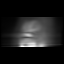
[frame 59/64]
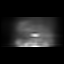

[Series 1: stress-gsp · 6.40mm/px · 6 of 512 frames shown]
[frame 43/512]
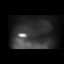
[frame 128/512]
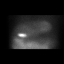
[frame 214/512]
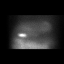
[frame 299/512]
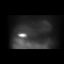
[frame 384/512]
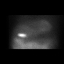
[frame 470/512]
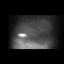

[Series 1: wbr_r-proj_st rest · 6.40mm/px · 6 of 64 frames shown]
[frame 6/64]
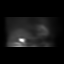
[frame 16/64]
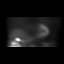
[frame 27/64]
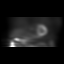
[frame 38/64]
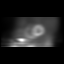
[frame 48/64]
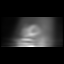
[frame 59/64]
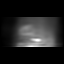

[Series 1: rest · 6.40mm/px · 6 of 64 frames shown]
[frame 6/64]
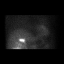
[frame 16/64]
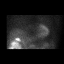
[frame 27/64]
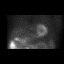
[frame 38/64]
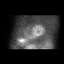
[frame 48/64]
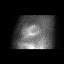
[frame 59/64]
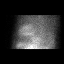

[36 of 36 positions shown; findings below may reference images not displayed]

Canned report from images found in remote index.

Refer to host system for actual result text.

## 2017-07-10 MED ORDER — REGADENOSON 0.4 MG/5ML IV SOLN
0.4000 mg | Freq: Once | INTRAVENOUS | Status: AC
  Administered 2017-07-10: 0.4 mg via INTRAVENOUS

## 2017-07-10 MED ORDER — TECHNETIUM TC 99M TETROFOSMIN IV KIT
10.4000 | PACK | Freq: Once | INTRAVENOUS | Status: AC | PRN
  Administered 2017-07-10: 10.4 via INTRAVENOUS
  Filled 2017-07-10: qty 11

## 2017-07-10 MED ORDER — TECHNETIUM TC 99M TETROFOSMIN IV KIT
32.5000 | PACK | Freq: Once | INTRAVENOUS | Status: AC | PRN
  Administered 2017-07-10: 32.5 via INTRAVENOUS
  Filled 2017-07-10: qty 33

## 2017-07-13 NOTE — Telephone Encounter (Signed)
Spoke with patient this morning and informed him of Dr. Serita KyleEnd's recommendation (ECHO) and set up an appt to see Dr. Okey DupreEnd on March 25

## 2017-07-14 DIAGNOSIS — M170 Bilateral primary osteoarthritis of knee: Secondary | ICD-10-CM | POA: Diagnosis not present

## 2017-07-17 ENCOUNTER — Ambulatory Visit (HOSPITAL_COMMUNITY): Payer: BLUE CROSS/BLUE SHIELD | Attending: Cardiology

## 2017-07-17 ENCOUNTER — Other Ambulatory Visit: Payer: Self-pay

## 2017-07-17 DIAGNOSIS — R0789 Other chest pain: Secondary | ICD-10-CM | POA: Insufficient documentation

## 2017-07-29 ENCOUNTER — Encounter: Payer: BLUE CROSS/BLUE SHIELD | Admitting: Family Medicine

## 2017-07-31 ENCOUNTER — Telehealth: Payer: Self-pay

## 2017-07-31 DIAGNOSIS — E785 Hyperlipidemia, unspecified: Secondary | ICD-10-CM

## 2017-07-31 HISTORY — PX: CARDIOVASCULAR STRESS TEST: SHX262

## 2017-07-31 NOTE — Telephone Encounter (Signed)
Spoke with patient and set up for lab work 3/4... He verbalized understanding

## 2017-08-03 ENCOUNTER — Other Ambulatory Visit: Payer: BLUE CROSS/BLUE SHIELD | Admitting: *Deleted

## 2017-08-03 ENCOUNTER — Encounter (INDEPENDENT_AMBULATORY_CARE_PROVIDER_SITE_OTHER): Payer: Self-pay

## 2017-08-03 DIAGNOSIS — E785 Hyperlipidemia, unspecified: Secondary | ICD-10-CM | POA: Diagnosis not present

## 2017-08-03 LAB — LIPID PANEL
CHOL/HDL RATIO: 3.2 ratio (ref 0.0–5.0)
CHOLESTEROL TOTAL: 158 mg/dL (ref 100–199)
HDL: 49 mg/dL (ref >39)
LDL CALC: 95 mg/dL (ref 0–99)
Triglycerides: 70 mg/dL (ref 0–149)
VLDL Cholesterol Cal: 14 mg/dL (ref 5–40)

## 2017-08-03 LAB — ALT: ALT: 20 IU/L (ref 0–44)

## 2017-08-05 ENCOUNTER — Other Ambulatory Visit: Payer: Self-pay

## 2017-08-05 MED ORDER — RANITIDINE HCL 150 MG PO TABS: 150.0000 mg | ORAL_TABLET | Freq: Two times a day (BID) | ORAL | 3 refills | Status: DC

## 2017-08-24 ENCOUNTER — Ambulatory Visit (INDEPENDENT_AMBULATORY_CARE_PROVIDER_SITE_OTHER): Payer: BLUE CROSS/BLUE SHIELD | Admitting: Internal Medicine

## 2017-08-24 ENCOUNTER — Encounter: Payer: Self-pay | Admitting: Internal Medicine

## 2017-08-24 VITALS — BP 122/86 | HR 86 | Ht 70.0 in | Wt 214.0 lb

## 2017-08-24 DIAGNOSIS — R0789 Other chest pain: Secondary | ICD-10-CM

## 2017-08-24 NOTE — Progress Notes (Signed)
Follow-up Outpatient Visit Date: 08/24/2017  Primary Care Provider: Ria Bush, [place] [address]  Chief Complaint: Follow-up atypical chest pain  HPI:  Christopher Ballard is a 57 y.o. year-old male with history of GERD, who presents for follow-up of atypical chest pain. I met Christopher Ballard in early February, at which time he complained of 3-4 weeks of "chest tightness" at rest. Pain began after he had slipped and caught himself awkwardly while in [place]. Myocardial perfusion stress test showed no ischemia or scar, though calculated LVEF was mildly reduced (47%). Subsequent echo showed normal LVEF (55-60%) with grade 1 diastolic dysfunction. There were no significant valvular abnormalities.  Today, Christopher Ballard reports feeling well.  He has not had any further chest pain.  He also denies shortness of breath, palpitations, lightheadedness, edema, and orthopnea.  He is able to do strenuous activities at work without any limitations.  He has stopped taking aspirin and ranitidine.  He did not notice any significant difference while on ranitidine.  --------------------------------------------------------------------------------------------------  Cardiovascular History & Procedures: Cardiovascular Problems:  Chest pain  Risk Factors:  Male gender, smokeless tobacco use, and age > 20  Cath/PCI:  None  CV Surgery:  None  EP Procedures and Devices:  None  Non-Invasive Evaluation(s):  TTE (07/17/17): Normal LV size with mild LVH. LVEF 55-60% with grade 1 diastolic dysfunction. Trivial MR. Normal RV size and function. Normal PA pressure.  Pharmacologic MPI (07/10/17): Low risk study without ischemia or scar. Calculated LVEF mildly reduced at 47% but appears visually normal.   Recent CV Pertinent Labs: Lab Results  Component Value Date   CHOL 158 08/03/2017   HDL 49 08/03/2017   LDLCALC 95 08/03/2017   LDLDIRECT 136.5 05/12/2008   TRIG 70  08/03/2017   CHOLHDL 3.2 08/03/2017   CHOLHDL 4 05/19/2012   K 4.1 07/05/2017   BUN 20 07/05/2017   CREATININE 0.99 07/05/2017    Past medical and surgical history were reviewed and updated in EPIC.  Medications: None  Allergies: Patient has no known allergies.  Social History   Socioeconomic History  . Marital status: Divorced    Spouse name: Not on file  . Number of children: 3  . Years of education: Not on file  . Highest education level: Not on file  Occupational History  . Occupation: Self-employed  Social Needs  . Financial resource strain: Not on file  . Food insecurity:    Worry: Not on file    Inability: Not on file  . Transportation needs:    Medical: Not on file    Non-medical: Not on file  Tobacco Use  . Smoking status: Never Smoker  . Smokeless tobacco: Current User    Types: Chew  Substance and Sexual Activity  . Alcohol use: Yes    Alcohol/week: 3.0 oz    Types: 5 Cans of beer per week  . Drug use: No  . Sexual activity: Not on file  Lifestyle  . Physical activity:    Days per week: Not on file    Minutes per session: Not on file  . Stress: Not on file  Relationships  . Social connections:    Talks on phone: Not on file    Gets together: Not on file    Attends religious service: Not on file    Active member of club or organization: Not on file    Attends meetings of clubs or organizations: Not on file    Relationship status:  Not on file  . Intimate partner violence:    Fear of current or ex partner: Not on file    Emotionally abused: Not on file    Physically abused: Not on file    Forced sexual activity: Not on file  Other Topics Concern  . Not on file  Social History Narrative   Caffeine: 1 cup coffee/day; 1 drink/day   Lives alone   Married   3 grown children   3 dogs   1 cat   Occupation: self employed (se[employer] system)   Activity: T25   Diet: good water, fruits/vegetables daily    Family History  Problem Relation Age of  Onset  . Seizures Father   . Heart disease Neg Hx     Review of Systems: A 12-system review of systems was performed and was negative except as noted in the HPI.  --------------------------------------------------------------------------------------------------  Physical Exam: BP 122/86   Pulse 86   Ht 5' 10"  (1.778 m)   Wt 214 lb (97.1 kg)   SpO2 95%   BMI 30.71 kg/m   General: NAD. HEENT: No conjunctival pallor or scleral icterus. Moist mucous membranes.  OP clear. Neck: Supple without lymphadenopathy, thyromegaly, JVD, or HJR. Lungs: Normal work of breathing. Clear to auscultation bilaterally without wheezes or crackles. Heart: Regular rate and rhythm without murmurs, rubs, or gallops. Non-displaced PMI. Abd: Bowel sounds present. Soft, NT/ND without hepatosplenomegaly Ext: No lower extremity edema. Skin: Warm and dry without rash.  Lab Results  Component Value Date   WBC 5.1 07/05/2017   HGB 16.5 07/05/2017   HCT 47.2 07/05/2017   MCV 93.1 07/05/2017   PLT 205 07/05/2017    Lab Results  Component Value Date   NA 139 07/05/2017   K 4.1 07/05/2017   CL 102 07/05/2017   CO2 26 07/05/2017   BUN 20 07/05/2017   CREATININE 0.99 07/05/2017   GLUCOSE 107 (H) 07/05/2017   ALT 20 08/03/2017    Lab Results  Component Value Date   CHOL 158 08/03/2017   HDL 49 08/03/2017   LDLCALC 95 08/03/2017   LDLDIRECT 136.5 05/12/2008   TRIG 70 08/03/2017   CHOLHDL 3.2 08/03/2017    --------------------------------------------------------------------------------------------------  ASSESSMENT AND PLAN: Atypical chest pain This has resolved since our last visit.  I suspect it was likely musculoskeletal.  Myocardial perfusion stress test did not show evidence of ischemia or scar.  Echo demonstrated normal LVEF with grade 1 diastolic dysfunction.  We discussed the importance of lifestyle modifications including weight loss and avoidance of fatty foods for primary  prevention.  Follow-up: Return to clinic as needed.  Nelva Bush, MD 08/25/2017 3:26 PM

## 2017-08-24 NOTE — Patient Instructions (Addendum)
Medication Instructions:  Your physician recommends that you continue on your current medications as directed. Please refer to the Current Medication list given to you today.  -- If you need a refill on your cardiac medications before your next appointment, please call your pharmacy. --  Labwork: None ordered  Testing/Procedures: None ordered  Follow-Up: Your physician wants you to follow-up as needed with Dr. End.    Thank you for choosing [facility]!!    Any Other Special Instructions Will Be Listed Below (If Applicable).         

## 2017-08-25 ENCOUNTER — Encounter: Payer: Self-pay | Admitting: Internal Medicine

## 2017-09-01 ENCOUNTER — Encounter: Payer: Self-pay | Admitting: Family Medicine

## 2018-05-21 DIAGNOSIS — Z8781 Personal history of (healed) traumatic fracture: Secondary | ICD-10-CM | POA: Diagnosis not present

## 2018-05-21 DIAGNOSIS — M170 Bilateral primary osteoarthritis of knee: Secondary | ICD-10-CM | POA: Diagnosis not present

## 2018-05-21 DIAGNOSIS — G8929 Other chronic pain: Secondary | ICD-10-CM | POA: Diagnosis not present

## 2018-05-21 DIAGNOSIS — M19011 Primary osteoarthritis, right shoulder: Secondary | ICD-10-CM | POA: Diagnosis not present

## 2018-05-21 DIAGNOSIS — M7541 Impingement syndrome of right shoulder: Secondary | ICD-10-CM | POA: Diagnosis not present

## 2018-06-05 DIAGNOSIS — R05 Cough: Secondary | ICD-10-CM | POA: Diagnosis not present

## 2018-06-05 DIAGNOSIS — J029 Acute pharyngitis, unspecified: Secondary | ICD-10-CM | POA: Diagnosis not present

## 2018-06-05 DIAGNOSIS — R067 Sneezing: Secondary | ICD-10-CM | POA: Diagnosis not present

## 2018-06-05 DIAGNOSIS — J40 Bronchitis, not specified as acute or chronic: Secondary | ICD-10-CM | POA: Diagnosis not present

## 2020-02-07 ENCOUNTER — Encounter: Payer: Self-pay | Admitting: Family Medicine

## 2020-02-07 ENCOUNTER — Telehealth (INDEPENDENT_AMBULATORY_CARE_PROVIDER_SITE_OTHER): Payer: BLUE CROSS/BLUE SHIELD | Admitting: Family Medicine

## 2020-02-07 VITALS — HR 76 | Temp 98.6°F

## 2020-02-07 DIAGNOSIS — Z20818 Contact with and (suspected) exposure to other bacterial communicable diseases: Secondary | ICD-10-CM | POA: Diagnosis not present

## 2020-02-07 DIAGNOSIS — U071 COVID-19: Secondary | ICD-10-CM | POA: Diagnosis not present

## 2020-02-07 MED ORDER — HYDROCOD POLST-CPM POLST ER 10-8 MG/5ML PO SUER: 5.0000 mL | Freq: Two times a day (BID) | ORAL | 0 refills | Status: DC | PRN

## 2020-02-07 MED ORDER — AMOXICILLIN 875 MG PO TABS: 875.0000 mg | ORAL_TABLET | Freq: Every day | ORAL | 0 refills | Status: DC

## 2020-02-07 NOTE — Progress Notes (Signed)
    I connected with Christopher Ballard on 02/07/20 at  4:20 PM EDT by video and verified that I am speaking with the correct person using two identifiers.   I discussed the limitations, risks, security and privacy concerns of performing an evaluation and management service by video and the availability of in person appointments. I also discussed with the patient that there may be a patient responsible charge related to this service. The patient expressed understanding and agreed to proceed.  Patient location: Home Provider Location: [place] Participants: Christopher Ballard and Durene Cal   Subjective:     Christopher Ballard is a 59 y.o. male presenting for Cough, Fever, Generalized Body Aches, and Covid Positive (02/04/20)     HPI  #Covid positive - 02/04/2020 - cough, fever, body aches - was coughing 2-3 weeks prior to symptoms - body aches and pain - symptoms started on 02/03/2020 - last night had cold chills - body aches and coughing pretty bad - took advil with improvement - fevers off and on - low level symptoms - temperature last night 101  Has been taking his wife's amoxicillin 9/5   Review of Systems   Social History   Tobacco Use  Smoking Status Never Smoker  Smokeless Tobacco Current User  . Types: Chew        Objective:   BP Readings from Last 3 Encounters:  08/24/17 122/86  07/06/17 114/76  07/05/17 125/86   Wt Readings from Last 3 Encounters:  08/24/17 214 lb (97.1 kg)  07/06/17 217 lb 8 oz (98.7 kg)  07/01/17 221 lb 6.4 oz (100.4 kg)   Pulse 76   Temp 98.6 F (37 C) (Oral)   SpO2 94%   Physical Exam Constitutional:      Appearance: Normal appearance. He is not ill-appearing.  HENT:     Head: Normocephalic and atraumatic.     Right Ear: External ear normal.     Left Ear: External ear normal.  Eyes:     Conjunctiva/sclera: Conjunctivae normal.  Pulmonary:     Effort: Pulmonary effort is normal. No respiratory distress.    Neurological:     Mental Status: He is alert. Mental status is at baseline.  Psychiatric:        Mood and Affect: Mood normal.        Behavior: Behavior normal.        Thought Content: Thought content normal.        Judgment: Judgment normal.        Assessment & Plan:   Problem List Items Addressed This Visit    None    Visit Diagnoses    COVID-19 virus infection    -  Primary   Relevant Medications   chlorpheniramine-HYDROcodone (TUSSIONEX PENNKINETIC ER) 10-8 MG/5ML SUER   Exposure to strep throat       Relevant Medications   amoxicillin (AMOXIL) 875 MG tablet     Discussed OTC treatment for viral illness ER precautions given Quarantine until 02/14/2020  Referral for monoclonal antibody treatment if eligible   Discussed that likely not strept throat, but as he noted some improvement will treat based on wife's diagnosis.    Return if symptoms worsen or fail to improve.  Christopher Child, MD

## 2020-02-08 ENCOUNTER — Other Ambulatory Visit: Payer: Self-pay | Admitting: Oncology

## 2020-02-08 DIAGNOSIS — U071 COVID-19: Secondary | ICD-10-CM

## 2020-02-08 NOTE — Progress Notes (Signed)
I connected by phone with  Christopher Ballard  to discuss the potential use of an new treatment for mild to moderate COVID-19 viral infection in non-hospitalized patients.   This patient is a age/sex that meets the FDA criteria for Emergency Use Authorization of casirivimab\imdevimab.  Has a (+) direct SARS-CoV-2 viral test result 1. Has mild or moderate COVID-19  2. Is ? 59 years of age and weighs ? 40 kg 3. Is NOT hospitalized due to COVID-19 4. Is NOT requiring oxygen therapy or requiring an increase in baseline oxygen flow rate due to COVID-19 5. Is within 10 days of symptom onset 6. Has at least one of the high risk factor(s) for progression to severe COVID-19 and/or hospitalization as defined in EUA. ? Specific high risk criteria: Obesity   Symptom onset  02/04/2020   I have spoken and communicated the following to the patient or parent/caregiver:   1. FDA has authorized the emergency use of casirivimab\imdevimab for the treatment of mild to moderate COVID-19 in adults and pediatric patients with positive results of direct SARS-CoV-2 viral testing who are 61 years of age and older weighing at least 40 kg, and who are at high risk for progressing to severe COVID-19 and/or hospitalization.   2. The significant known and potential risks and benefits of casirivimab\imdevimab, and the extent to which such potential risks and benefits are unknown.   3. Information on available alternative treatments and the risks and benefits of those alternatives, including clinical trials.   4. Patients treated with casirivimab\imdevimab should continue to self-isolate and use infection control measures (e.g., wear mask, isolate, social distance, avoid sharing personal items, clean and disinfect "high touch" surfaces, and frequent handwashing) according to CDC guidelines.    5. The patient or parent/caregiver has the option to accept or refuse casirivimab\imdevimab .   After reviewing this information with the  patient, The patient agreed to proceed with receiving casirivimab\imdevimab infusion and will be provided a copy of the Fact sheet prior to receiving the infusion.Mignon Pine, AGNP-C [phone] (Infusion Center Hotline)

## 2020-02-09 ENCOUNTER — Other Ambulatory Visit (HOSPITAL_COMMUNITY): Payer: Self-pay | Admitting: Adult Health

## 2020-02-09 ENCOUNTER — Ambulatory Visit (HOSPITAL_COMMUNITY)
Admission: RE | Admit: 2020-02-09 | Discharge: 2020-02-09 | Disposition: A | Discharge status: Home / Self Care | Payer: BLUE CROSS/BLUE SHIELD | Source: Ambulatory Visit | Attending: Pulmonary Disease | Admitting: Pulmonary Disease

## 2020-02-09 DIAGNOSIS — U071 COVID-19: Secondary | ICD-10-CM | POA: Diagnosis not present

## 2020-02-09 MED ORDER — METHYLPREDNISOLONE SODIUM SUCC 125 MG IJ SOLR: 125.0000 mg | Freq: Once | INTRAMUSCULAR | Status: DC | PRN

## 2020-02-09 MED ORDER — EPINEPHRINE 0.3 MG/0.3ML IJ SOAJ: 0.3000 mg | Freq: Once | INTRAMUSCULAR | Status: DC | PRN

## 2020-02-09 MED ORDER — SODIUM CHLORIDE 0.9 % IV SOLN: INTRAVENOUS | Status: DC | PRN

## 2020-02-09 MED ORDER — SODIUM CHLORIDE 0.9 % IV SOLN
1200.0000 mg | Freq: Once | INTRAVENOUS | Status: AC
  Administered 2020-02-09: 1200 mg via INTRAVENOUS
  Filled 2020-02-09: qty 10

## 2020-02-09 MED ORDER — FAMOTIDINE IN NACL 20-0.9 MG/50ML-% IV SOLN: 20.0000 mg | Freq: Once | INTRAVENOUS | Status: DC | PRN

## 2020-02-09 MED ORDER — ACETAMINOPHEN 325 MG PO TABS
650.0000 mg | ORAL_TABLET | Freq: Four times a day (QID) | ORAL | Status: DC | PRN
  Administered 2020-02-09: 650 mg via ORAL
  Filled 2020-02-09: qty 2

## 2020-02-09 MED ORDER — BENZONATATE 100 MG PO CAPS: 100.0000 mg | ORAL_CAPSULE | Freq: Three times a day (TID) | ORAL | 0 refills | Status: DC | PRN

## 2020-02-09 MED ORDER — ALBUTEROL SULFATE HFA 108 (90 BASE) MCG/ACT IN AERS: 2.0000 | INHALATION_SPRAY | Freq: Once | RESPIRATORY_TRACT | Status: DC | PRN

## 2020-02-09 MED ORDER — DIPHENHYDRAMINE HCL 50 MG/ML IJ SOLN: 50.0000 mg | Freq: Once | INTRAMUSCULAR | Status: DC | PRN

## 2020-02-09 NOTE — Progress Notes (Signed)
  Diagnosis: COVID-19  Physician: Wright    Procedure: Covid Infusion Clinic Med: casirivimab\imdevimab infusion - Provided patient with casirivimab\imdevimab fact sheet for patients, parents and caregivers prior to infusion.  Complications: No immediate complications noted.  Discharge: Discharged home   Darel Ricketts S 02/09/2020   

## 2020-02-09 NOTE — Discharge Instructions (Signed)

## 2020-02-09 NOTE — Progress Notes (Signed)
Patient in Monoclonal antibody infusion clinic c/o cough and requesting intervention.  Tessalon Perrles 100mg  tid called into his pharmacy.  He has Tussionex to use at night.  , NP

## 2020-02-13 ENCOUNTER — Telehealth: Payer: Self-pay | Admitting: Family Medicine

## 2020-02-13 NOTE — Telephone Encounter (Signed)
Spoke with pt relaying Dr. Timoteo Expose message.  Pt verbalizes understanding.  States dyspnea is not worsening.  Just when he takes a deep breath it triggers a cough.

## 2020-02-13 NOTE — Telephone Encounter (Signed)
[facility] Day - Client TELEPHONE ADVICE RECORD AccessNurse Patient Name: Christopher Ballard Gender: Male DOB: Oct 19, 1960 Age: 59 Y 11 M 11 D Return Phone Number: [phone] (Primary) [place] [address] Client [facility] Day - Client Client Site [facility] - Day Physician Eustaquio Boyden - MD Contact Type Call Who Is Calling Patient / Member / Family / Caregiver Call Type Triage / Clinical Relationship To Patient Self Return Phone Number [phone] (Primary) Chief Complaint BREATHING - shortness of breath or sounds breathless Reason for Call Symptomatic / Request for Health Information Initial Comment Caller states he has a really cough. He has trouble getting a deep breath. He has a virtual appointment tomorrow, but wants to make sure it is ok to wait until then. He is positive for COVID. Translation No Nurse Assessment Nurse: Thana Farr, RN, Ladona Ridgel Date/Time Christopher Ballard Time): 02/13/2020 2:12:08 PM Confirm and document reason for call. If symptomatic, describe symptoms. ---Pt states that symptoms started on the 4th and tested positive for covid on 5th. Pt still having bad non productive cough still. Pt drinking and urinating normally. Pt denies shortness of breath, chest pain, difficulty breathing, vd, nasal congestion, or any other symptoms at this time. Has the patient had close contact with a person known or suspected to have the novel coronavirus illness OR traveled / lives in area with major community spread (including international travel) in the last 14 days from the onset of symptoms? * If Asymptomatic, screen for exposure and travel within the last 14 days. ---No Does the patient have any new or worsening symptoms? ---Yes Will a triage be completed? ---Yes Related visit to physician within the last 2 weeks? ---No Does the PT have any chronic conditions? (i.e.  diabetes, asthma, this includes High risk factors for pregnancy, etc.) ---No Is this a behavioral health or substance abuse call? ---No Guidelines Guideline Title Affirmed Question Affirmed Notes Nurse Date/Time (Eastern Time) COVID-19 - Diagnosed or Suspected [1] Continuous (nonstop) coughing interferes with work or school AND [2] no improvement using Thana Farr, Psychiatrist 02/13/2020 2:14:23 PMPLEASE NOTE: All timestamps contained within this report are represented as [place] Standard Time. CONFIDENTIALTY NOTICE: This fax transmission is intended only for the addressee. It contains information that is legally privileged, confidential or otherwise protected from use or disclosure. If you are not the intended recipient, you are strictly prohibited from reviewing, disclosing, copying using or disseminating any of this information or taking any action in reliance on or regarding this information. If you have received this fax in error, please notify us immediately by telephone so that we can arrange for its return to [place]. Phone: [phone], Toll-Free: [phone], Fax: [phone] Page: 2 of 2 Call Id: [id number] Guidelines Guideline Title Affirmed Question Affirmed Notes Nurse Date/Time Christopher Ballard Time) cough treatment per protocol Disp. Time Christopher Ballard Time) Disposition Final User 02/13/2020 2:10:13 PM Send to Urgent Queue Young Berry 02/13/2020 2:18:32 PM Call PCP within 24 Hours Yes Thana Farr, RN, Ursula Beath Disagree/Comply Comply Caller Understands Yes PreDisposition Did not know what to do Care Advice Given Per Guideline CALL PCP WITHIN 24 HOURS: * You become worse * Chest pain or difficulty breathing occurs * Fever over 103 F (39.4 C) CALL BACK IF: FEVER MEDICINES: Referrals REFERRED TO PCP OFFICE

## 2020-02-13 NOTE — Telephone Encounter (Signed)
Appointment 9/14 virtual Pt stated at times hard to breath Sent pt to access nurse to be traige

## 2020-02-13 NOTE — Telephone Encounter (Addendum)
Any worsening dyspnea? We wouldn't bring pt into office due to current COVID symptoms so if they feel he's worsening, recommend [facility] eval today for physical exam and possible CXR.  Otherwise we can schedule virtual visit with me tomorrow for 12:30pm.  If dyspnea much worse, or lips or fingers turning blue, rec ER eval.

## 2020-02-13 NOTE — Telephone Encounter (Signed)
Patient's girlfriend, Florentina Addison, called. Patient had a virtual visit with Dr.Cody.  Patient still has cough,nausea.  Katie said patient needs a chest x-ray.  Please call patient back at [phone].

## 2020-02-13 NOTE — Telephone Encounter (Signed)
Would recommend office visit (virtual with PCP to discuss symptoms).   Pt with covid-19 and s/p monoclonal antibody treatment.   Routing to scheduler to schedule and PCP as FYI  If concerns with scheduling would recommend triage nurse.

## 2020-02-13 NOTE — Telephone Encounter (Signed)
Called patient to schedule appointment as per message. LVM to call back.

## 2020-02-13 NOTE — Telephone Encounter (Signed)
Pt called back to schedule appointment Dr g can pt be worked in or do you want me to put with another provider  Please advise  Pt has bad cough nausea Just does not feel good Tested positive covid 9/5

## 2020-02-14 ENCOUNTER — Telehealth: Payer: Self-pay | Admitting: Family Medicine

## 2020-02-14 ENCOUNTER — Telehealth: Payer: Self-pay | Admitting: *Deleted

## 2020-02-14 ENCOUNTER — Telehealth (INDEPENDENT_AMBULATORY_CARE_PROVIDER_SITE_OTHER): Payer: BLUE CROSS/BLUE SHIELD | Admitting: Family Medicine

## 2020-02-14 ENCOUNTER — Other Ambulatory Visit: Payer: Self-pay

## 2020-02-14 ENCOUNTER — Encounter: Payer: Self-pay | Admitting: Family Medicine

## 2020-02-14 DIAGNOSIS — U071 COVID-19: Secondary | ICD-10-CM | POA: Insufficient documentation

## 2020-02-14 HISTORY — DX: COVID-19: U07.1

## 2020-02-14 MED ORDER — FLOVENT HFA 220 MCG/ACT IN AERO: 2.0000 | INHALATION_SPRAY | Freq: Two times a day (BID) | RESPIRATORY_TRACT | 0 refills | Status: DC

## 2020-02-14 MED ORDER — PULMICORT FLEXHALER 180 MCG/ACT IN AEPB: 4.0000 | INHALATION_SPRAY | Freq: Two times a day (BID) | RESPIRATORY_TRACT | 0 refills | Status: DC

## 2020-02-14 MED ORDER — BENZONATATE 100 MG PO CAPS: 100.0000 mg | ORAL_CAPSULE | Freq: Three times a day (TID) | ORAL | 0 refills | Status: DC | PRN

## 2020-02-14 MED ORDER — PULMICORT FLEXHALER 180 MCG/ACT IN AEPB
4.0000 | INHALATION_SPRAY | Freq: Two times a day (BID) | RESPIRATORY_TRACT | 0 refills | Status: DC
Start: 2020-02-14 — End: 2020-02-14

## 2020-02-14 MED ORDER — ALBUTEROL SULFATE HFA 108 (90 BASE) MCG/ACT IN AERS: 2.0000 | INHALATION_SPRAY | Freq: Four times a day (QID) | RESPIRATORY_TRACT | 0 refills | Status: DC | PRN

## 2020-02-14 NOTE — Telephone Encounter (Signed)
Sent in new ICS to price out.

## 2020-02-14 NOTE — Telephone Encounter (Signed)
Katie friend of patient (not on [place]) called stating that patient is having a problem getting his Pulmicort filled. Florentina Addison stated that [facility] not [facility] so [facility]. Katie stated that she was told that the medication needs authorization and  the script needs to be switched to [facility] or [facility]. Advised Katie that doesn't make much sense and I would call the pharmacy. Called and spoke to [place] at [facility] and was advised that the Pulmicort requires a PA. Christopher Ballard stated that he does not know what the insurance will cover without having to get a PA.

## 2020-02-14 NOTE — Telephone Encounter (Signed)
Noted  

## 2020-02-14 NOTE — Telephone Encounter (Signed)
plz place on covid call list and call tomorrow for update on symptoms specifically dyspnea and cough, and see how he did with pulmicort and albuterol inhalers.

## 2020-02-14 NOTE — Progress Notes (Signed)
Virtual visit attempted through MyChart, a video enabled telemedicine application. Due to national recommendations of social distancing due to COVID-19, a virtual visit is felt to be most appropriate for this patient at this time. Reviewed limitations, risks, security and privacy concerns of performing a virtual visit and the availability of in person appointments. I also reviewed that there may be a patient responsible charge related to this service. The patient agreed to proceed.   Interactive audio and video telecommunications were attempted between myself and Christopher Ballard, however failed due to patient having technical difficulties OR patient not having access to video capability.  We continued and completed visit with audio only.  Time: 12:50pm - 1:06pm   Patient location: home Provider location: [place] at [facility], office Persons participating in this virtual visit: patient, provider, wife  If any vitals were documented, they were collected by patient at home unless specified below.    BP (!) 131/92   Pulse 73   Temp 97.8 F (36.6 C)   Ht 5\' 10"  (1.778 m)   Wt 217 lb (98.4 kg)   SpO2 95%   BMI 31.14 kg/m    CC: cough due to COVID Subjective:    Patient ID: , male    DOB: [birth year], 59 y.o.   MRN: [id number]  HPI: Christopher Ballard is a 59 y.o. male presenting on 02/14/2020 for Cough (C/o cough, SOB- O2 sat will drop to 88% but comes back up to about 95% when rested.  Pt is COVID+.  COVID sxs started 02/04/20. )   Recently saw Dr 04/05/20 with cough due to COVID. Note reviewed.  First day of symptoms was 02/04/2020, positive covid test on 02/05/2020.  Received mAb infusion on 02/09/2020.  Has been using vit C, zinc, vit D, magnesium, currently taking aspirin and tylenol. He stopped amoxicillin. Hycodan for cough - has stopped this as well.   2 wk h/o cough with recent dyspnea, no wheezing. Some nausea and abd discomfort - better since stopping amoxicillin.  O2  drops to 88% with exertion or when supine, does improve to 95% with deep breathing and sitting up. No significant exertional hypoxia noted.  Denies loss of taste or smell, diarrhea. No ST, PNdrainage, HA, body aches.   Today actually symptoms are improved some.  Non smoker.  No asthma.   Received first Pfizer vaccine 01/27/2020.        Relevant past medical, surgical, family and social history reviewed and updated as indicated. Interim medical history since our last visit reviewed. Allergies and medications reviewed and updated. Outpatient Medications Prior to Visit  Medication Sig Dispense Refill  . Ascorbic Acid (VITAMIN C) 100 MG tablet Take 100 mg by mouth daily.    . Cholecalciferol (VITAMIN D3 PO) Take by mouth daily.    . naproxen sodium (ALEVE) 220 MG tablet Take 220 mg by mouth daily as needed.    . Zinc Sulfate (ZINC 15 PO) Take by mouth.    . benzonatate (TESSALON) 100 MG capsule Take 1 capsule (100 mg total) by mouth 3 (three) times daily as needed for cough. 20 capsule 0  . chlorpheniramine-HYDROcodone (TUSSIONEX PENNKINETIC ER) 10-8 MG/5ML SUER Take 5 mLs by mouth every 12 (twelve) hours as needed for cough. 140 mL 0  . amoxicillin (AMOXIL) 875 MG tablet Take 1 tablet (875 mg total) by mouth daily for 10 days. 10 tablet 0   No facility-administered medications prior to visit.     Per HPI unless  specifically indicated in ROS section below Review of Systems Objective:  BP (!) 131/92   Pulse 73   Temp 97.8 F (36.6 C)   Ht 5\' 10"  (1.778 m)   Wt 217 lb (98.4 kg)   SpO2 95%   BMI 31.14 kg/m   Wt Readings from Last 3 Encounters:  02/14/20 217 lb (98.4 kg)  08/24/17 214 lb (97.1 kg)  07/06/17 217 lb 8 oz (98.7 kg)       Physical exam: Gen: alert, NAD, not ill appearing Pulm: speaks in complete sentences without increased work of breathing, but cough present Psych: normal mood, normal thought content      Results for orders placed or performed in visit on  08/03/17  ALT  Result Value Ref Range   ALT 20 0 - 44 IU/L  Lipid Profile  Result Value Ref Range   Cholesterol, Total 158 100 - 199 mg/dL   Triglycerides 70 0 - 149 mg/dL   HDL 49 10/03/17 mg/dL   VLDL Cholesterol Cal 14 5 - 40 mg/dL   LDL Calculated 95 0 - 99 mg/dL   Chol/HDL Ratio 3.2 0.0 - 5.0 ratio   Assessment & Plan:   Problem List Items Addressed This Visit    COVID-19 virus infection    Today is day 10 of symptoms. But does endorse cough present for about 2 weeks now. Reassuringly notes improvement in symptoms over the last 24 hours. Vitals overall stable today with good O2 sat. Extensively reviewed indications to seek in person care including persistent hypoxia <90%, worsening dyspnea or cyanosis or cough. Will Rx pulmicort ICS as well as albuterol PRN. Pt and wife agree with plan. Symptoms not consistent with pneumonia at this time, but merits close follow up. Will place on COVID f/u phone call list.  Continue vit C, vit D, zinc. Tessalon perls refilled.       Relevant Medications   benzonatate (TESSALON) 100 MG capsule       Meds ordered this encounter  Medications  . albuterol (VENTOLIN HFA) 108 (90 Base) MCG/ACT inhaler    Sig: Inhale 2 puffs into the lungs every 6 (six) hours as needed for wheezing or shortness of breath.    Dispense:  8 g    Refill:  0  . budesonide (PULMICORT FLEXHALER) 180 MCG/ACT inhaler    Sig: Inhale 4 puffs into the lungs in the morning and at bedtime. For 5 days    Dispense:  1 each    Refill:  0  . benzonatate (TESSALON) 100 MG capsule    Sig: Take 1 capsule (100 mg total) by mouth 3 (three) times daily as needed for cough.    Dispense:  20 capsule    Refill:  0   No orders of the defined types were placed in this encounter.   I discussed the assessment and treatment plan with the patient. The patient was provided an opportunity to ask questions and all were answered. The patient agreed with the plan and demonstrated an understanding of  the instructions. The patient was advised to call back or seek an in-person evaluation if the symptoms worsen or if the condition fails to improve as anticipated.  Follow up plan: No follow-ups on file.  >35, MD

## 2020-02-14 NOTE — Assessment & Plan Note (Addendum)
Today is day 10 of symptoms. But does endorse cough present for about 2 weeks now. Reassuringly notes improvement in symptoms over the last 24 hours. Vitals overall stable today with good O2 sat. Extensively reviewed indications to seek in person care including persistent hypoxia <90%, worsening dyspnea or cyanosis or cough. Will Rx pulmicort ICS as well as albuterol PRN. Pt and wife agree with plan. Symptoms not consistent with pneumonia at this time, but merits close follow up. Will place on COVID f/u phone call list.  Continue vit C, vit D, zinc. Tessalon perls refilled.

## 2020-02-14 NOTE — Telephone Encounter (Signed)
Spoke with pt relaying Dr. Timoteo Expose message.  Pt verbalizes understanding and will let us know about the price.

## 2020-02-15 NOTE — Telephone Encounter (Signed)
Lvm asking pt to call back.  Need an update on sxs (see Dr. Timoteo Expose message below).

## 2020-02-16 DIAGNOSIS — J1282 Pneumonia due to coronavirus disease 2019: Secondary | ICD-10-CM | POA: Diagnosis not present

## 2020-02-23 NOTE — Telephone Encounter (Addendum)
Pt never returned call. Can we call again to f/u on covid symptoms from last week ensure improved?

## 2020-02-24 NOTE — Telephone Encounter (Signed)
Noted! Thank you

## 2020-02-24 NOTE — Telephone Encounter (Signed)
Spoke with pt asking for an update.  Says he feels better.  Still has a cough, but it's improving.  Pt was seen at an UC in [facility].  Had CXR, dx PNA and given steroid and has finished it.  Pt could not remember what day he was seen or name of UC.  But he will get them to send records.

## 2021-06-26 ENCOUNTER — Encounter: Payer: Self-pay | Admitting: Family Medicine

## 2021-06-26 ENCOUNTER — Other Ambulatory Visit: Payer: Self-pay

## 2021-06-26 ENCOUNTER — Telehealth (INDEPENDENT_AMBULATORY_CARE_PROVIDER_SITE_OTHER): Payer: BLUE CROSS/BLUE SHIELD | Admitting: Family Medicine

## 2021-06-26 VITALS — HR 70 | Temp 98.8°F | Ht 70.0 in | Wt 220.0 lb

## 2021-06-26 DIAGNOSIS — U071 COVID-19: Secondary | ICD-10-CM

## 2021-06-26 MED ORDER — FLUTICASONE PROPIONATE HFA 220 MCG/ACT IN AERO: 2.0000 | INHALATION_SPRAY | Freq: Two times a day (BID) | RESPIRATORY_TRACT | 0 refills | Status: DC

## 2021-06-26 MED ORDER — CHERATUSSIN AC 100-10 MG/5ML PO SOLN: 5.0000 mL | Freq: Three times a day (TID) | ORAL | 0 refills | Status: DC | PRN

## 2021-06-26 NOTE — Progress Notes (Signed)
Patient ID: Christopher Ballard, male    DOB: [birth year], 61 y.o.   MRN: [id number]  Virtual visit completed through MyChart, a video enabled telemedicine application. Due to national recommendations of social distancing due to COVID-19, a virtual visit is felt to be most appropriate for this patient at this time. Reviewed limitations, risks, security and privacy concerns of performing a virtual visit and the availability of in person appointments. I also reviewed that there may be a patient responsible charge related to this service. The patient agreed to proceed.   Patient location: home Provider location: [place] at [facility], office Persons participating in this virtual visit: patient, provider   If any vitals were documented, they were collected by patient at home unless specified below.    Pulse 70    Temp 98.8 F (37.1 C)    Ht 5\' 10"  (1.778 m)    Wt 220 lb (99.8 kg)    SpO2 100% Comment: RA   BMI 31.57 kg/m    CC: COVID positive Subjective:   HPI: Christopher Ballard is a 60 y.o. male presenting on 06/26/2021 for Covid Positive (C/o cough, runny nose, nasal congestion and body fevers.  Sxs started 06/20/20.  Pos home COVID- 124/23.  Tried OTC cough meds, not helpful. Only had 1 dose of Pfizer COVID shot- 01/2020, no flu shot. )   First day of symptoms: 06/20/2021 Tested COVID positive: 06/25/2021  Current symptoms: fatigue, body aches, weakness, overall dry cough which may be progressively worsening with coughing fits, trouble sleeping due to cough, head congestion, rhinorrhea. Mild ST.  No: fevers/chills, ear or tooth pain, abd pain, nausea, dyspnea or wheezing.  Treatments to date: OTC cough medicines including mucinex flu and cold and zymax, zinc vit C and vit D.  Risk factors include: age, BMI >30.   Mom also got COVID a few days ago.  No h/o asthma or smoking history.   Has continued working in interim.   COVID vaccination status: 1 dose of Pfizer 01/2020     Relevant  past medical, surgical, family and social history reviewed and updated as indicated. Interim medical history since our last visit reviewed. Allergies and medications reviewed and updated. Outpatient Medications Prior to Visit  Medication Sig Dispense Refill   Ascorbic Acid (VITAMIN C) 100 MG tablet Take 100 mg by mouth daily.     naproxen sodium (ALEVE) 220 MG tablet Take 220 mg by mouth daily as needed.     albuterol (VENTOLIN HFA) 108 (90 Base) MCG/ACT inhaler Inhale 2 puffs into the lungs every 6 (six) hours as needed for wheezing or shortness of breath. 8 g 0   benzonatate (TESSALON) 100 MG capsule Take 1 capsule (100 mg total) by mouth 3 (three) times daily as needed for cough. 20 capsule 0   Cholecalciferol (VITAMIN D3 PO) Take by mouth daily.     fluticasone (FLOVENT HFA) 220 MCG/ACT inhaler Inhale 2 puffs into the lungs in the morning and at bedtime. 1 each 0   Zinc Sulfate (ZINC 15 PO) Take by mouth.     No facility-administered medications prior to visit.     Per HPI unless specifically indicated in ROS section below Review of Systems Objective:  Pulse 70    Temp 98.8 F (37.1 C)    Ht 5\' 10"  (1.778 m)    Wt 220 lb (99.8 kg)    SpO2 100% Comment: RA   BMI 31.57 kg/m   Wt Readings from Last 3 Encounters:  06/26/21 220 lb (99.8 kg)  02/14/20 217 lb (98.4 kg)  08/24/17 214 lb (97.1 kg)       Physical exam: Gen: alert, NAD, not ill appearing Pulm: speaks in complete sentences without increased work of breathing Psych: normal mood, normal thought content      Assessment & Plan:   Problem List Items Addressed This Visit     COVID-19 virus infection - Primary    Reviewed currently approved EUA treatments. He is outside of window for antiviral treatment.  Reviewed expected course of illness, anticipated course of recovery, as well as red flags to suggest COVID pneumonia and/or to seek urgent in-person care.  Reviewed latest CDC isolation/quarantine guidelines.  Encouraged  fluids and rest. Reviewed further supportive care measures at home including vit C 500mg  bid, vit D 2000 IU daily, zinc 100mg  daily, tylenol PRN, pepcid 20mg  BID PRN.   Recommend:  Cheratussin cough syrup.  Flovent inhaled steroid which was very beneficial when he last had COVID 02/2020.  Update if not improving with treatment.         Meds ordered this encounter  Medications   guaiFENesin-codeine (CHERATUSSIN AC) 100-10 MG/5ML syrup    Sig: Take 5 mLs by mouth 3 (three) times daily as needed for cough.    Dispense:  120 mL    Refill:  0   fluticasone (FLOVENT HFA) 220 MCG/ACT inhaler    Sig: Inhale 2 puffs into the lungs in the morning and at bedtime.    Dispense:  1 each    Refill:  0   No orders of the defined types were placed in this encounter.   I discussed the assessment and treatment plan with the patient. The patient was provided an opportunity to ask questions and all were answered. The patient agreed with the plan and demonstrated an understanding of the instructions. The patient was advised to call back or seek an in-person evaluation if the symptoms worsen or if the condition fails to improve as anticipated.  Follow up plan: No follow-ups on file.  , MD

## 2021-06-26 NOTE — Assessment & Plan Note (Addendum)
Reviewed currently approved EUA treatments. He is outside of window for antiviral treatment.  Reviewed expected course of illness, anticipated course of recovery, as well as red flags to suggest COVID pneumonia and/or to seek urgent in-person care.  Reviewed latest CDC isolation/quarantine guidelines.  Encouraged fluids and rest. Reviewed further supportive care measures at home including vit C 500mg  bid, vit D 2000 IU daily, zinc 100mg  daily, tylenol PRN, pepcid 20mg  BID PRN.   Recommend:  Cheratussin cough syrup.  Flovent inhaled steroid which was very beneficial when he last had COVID 02/2020.  Update if not improving with treatment.

## 2021-06-28 ENCOUNTER — Telehealth: Payer: Self-pay

## 2021-06-28 NOTE — Telephone Encounter (Signed)
Received faxed PA request from [facility] for fluticasone proionate HFA (Flovent HFA) 220 mcg/ACT inhaler; key: [id number], Rx #:  [id number].  Decision pending.

## 2021-07-02 NOTE — Telephone Encounter (Addendum)
Plz notify pt generic flovent denied.  If desired, we can send in another medication that may be better covered.  If he's already filled, likely don't need to send in a new med as this is just a temporary treatment.   Insurance may better cover brand Flovent, Asmanex, Qvar.

## 2021-07-02 NOTE — Telephone Encounter (Signed)
PA denied due to patient has to try and failed 2 alternatives medications such as : Advair brand, Asmanex, Breo Ellipta, brand Flovent, Qvar, brand Symbicort, etc.   It looks like maybe it will cover brand name Flovent but not generic? Placed fax from insurance in Dr Reece Agar inbox for review.

## 2021-07-02 NOTE — Telephone Encounter (Signed)
Attempted to contact pt.  No answer.  Vm box full.  Need to relay Dr. G's message.  

## 2021-07-04 NOTE — Telephone Encounter (Signed)
Attempted to contact pt.  No answer.  Vm box full.  Need to relay Dr. G's message.  

## 2021-07-05 NOTE — Telephone Encounter (Signed)
Attempted to contact pt.  No answer.  Vm box full.  Need to relay Dr. G's message.  

## 2021-07-09 NOTE — Telephone Encounter (Signed)
Attempted to contact pt.  No answer.  Vm box full.  Need to relay Dr. G's message.  

## 2021-07-17 NOTE — Telephone Encounter (Signed)
Spoke to patient by telephone and was advised that the pharmacy changed the medication for him. Patient stated that he has now finished the medication that was substituted and he is doing fine now.

## 2022-03-03 ENCOUNTER — Encounter: Payer: Self-pay | Admitting: Family

## 2022-03-03 ENCOUNTER — Ambulatory Visit: Payer: BLUE CROSS/BLUE SHIELD | Admitting: Family

## 2022-03-03 ENCOUNTER — Other Ambulatory Visit: Payer: Self-pay | Admitting: Family

## 2022-03-03 VITALS — BP 150/108 | HR 80 | Temp 98.8°F | Resp 16 | Ht 70.0 in | Wt 222.4 lb

## 2022-03-03 DIAGNOSIS — I517 Cardiomegaly: Secondary | ICD-10-CM | POA: Diagnosis not present

## 2022-03-03 DIAGNOSIS — R0789 Other chest pain: Secondary | ICD-10-CM | POA: Insufficient documentation

## 2022-03-03 DIAGNOSIS — I10 Essential (primary) hypertension: Secondary | ICD-10-CM

## 2022-03-03 LAB — CBC
HCT: 41.8 % (ref 39.0–52.0)
Hemoglobin: 14.8 g/dL (ref 13.0–17.0)
MCHC: 35.5 g/dL (ref 30.0–36.0)
MCV: 92.8 fl (ref 78.0–100.0)
Platelets: 179 10*3/uL (ref 150.0–400.0)
RBC: 4.51 Mil/uL (ref 4.22–5.81)
RDW: 12.8 % (ref 11.5–15.5)
WBC: 5.5 10*3/uL (ref 4.0–10.5)

## 2022-03-03 LAB — COMPREHENSIVE METABOLIC PANEL
ALT: 21 U/L (ref 0–53)
AST: 19 U/L (ref 0–37)
Albumin: 4.5 g/dL (ref 3.5–5.2)
Alkaline Phosphatase: 81 U/L (ref 39–117)
BUN: 23 mg/dL (ref 6–23)
CO2: 30 mEq/L (ref 19–32)
Calcium: 9.7 mg/dL (ref 8.4–10.5)
Chloride: 104 mEq/L (ref 96–112)
Creatinine, Ser: 1.05 mg/dL (ref 0.40–1.50)
GFR: 76.89 mL/min (ref >60.00)
Glucose, Bld: 92 mg/dL (ref 70–99)
Potassium: 4.4 mEq/L (ref 3.5–5.1)
Sodium: 140 mEq/L (ref 135–145)
Total Bilirubin: 1.1 mg/dL (ref 0.2–1.2)
Total Protein: 7 g/dL (ref 6.0–8.3)

## 2022-03-03 LAB — TSH: TSH: 1.32 u[IU]/mL (ref 0.35–5.50)

## 2022-03-03 MED ORDER — AMLODIPINE BESYLATE 5 MG PO TABS: 5.0000 mg | ORAL_TABLET | Freq: Every day | ORAL | 0 refills | Status: DC

## 2022-03-03 NOTE — Patient Instructions (Addendum)
Start amlodipine 5 mg once daily.   Start monitoring your blood pressure daily, around the same time of day, for the next 2-3 weeks.  Ensure that you have rested for 30 minutes prior to checking your blood pressure. Record your readings and bring them to your next visit. GOAL BLOOD PRESSURE IS <140/90  If consistently over 160 on top of 100 on bottom, please let us know more urgently.  If any increase in blurry vision, and or worsening headache, nausea/vomiting or chest pain go to er and or call 911 immediately.    Regards,   Eugenia Pancoast FNP-C

## 2022-03-03 NOTE — Progress Notes (Signed)
Established Patient Office Visit  Subjective:  Patient ID: Christopher Ballard, male    DOB: [birth year]  Age: 61 y.o. MRN: [id number]  CC:  Chief Complaint  Patient presents with   Headache    Head pressure   Dizziness    HPI Christopher Ballard is here today with concerns.   Over the last few weeks with increasing head pressure.  He states that he felt that he might have had a temperature over the last few weeks, but never checked to see if he had a temperature. Blurry vision today but he also has noticed this for a while now.   He does note some slight increased sob  He denies a cough, nothing new at least he does state he has a chronic cough.  He does have some chest heaviness from time to time.   In 2019, seen and evaluated by cardiology for atypical chest pain which has since resolved. Stress test was without ischemia or scaring. Echo with lvef with grade 1 diastolic dysfunction.   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 10/2014   Bicipital tendinitis of left shoulder 10/2014   COVID-19 virus infection 02/14/2020   Dental crowns present    GERD (gastroesophageal reflux disease)    Impingement syndrome of left shoulder region 10/2014    Past Surgical History:  Procedure Laterality Date   BICEPT TENODESIS Left 10/19/2014   Procedure: BICEPS TENODESIS;  Surgeon: Kathryne Hitch, MD;  Location: [facility];  Service: Orthopedics;  Laterality: Left;   CARDIOVASCULAR STRESS TEST  07/2017   low risk study (Dr End)   COLONOSCOPY WITH PROPOFOL  07/23/2012   WNL Carlean Purl)   WISDOM TOOTH EXTRACTION  1987    Family History  Problem Relation Age of Onset   Seizures Father    Heart disease Neg Hx     Social History   Socioeconomic History   Marital status: Divorced    Spouse name: Not on file   Number of children: 3   Years of education: Not on file   Highest education level: Not on file  Occupational History    Occupation: Self-employed  Tobacco Use   Smoking status: Never   Smokeless tobacco: Current    Types: Chew  Substance and Sexual Activity   Alcohol use: Yes    Alcohol/week: 5.0 standard drinks of alcohol    Types: 5 Cans of beer per week   Drug use: No   Sexual activity: Not on file  Other Topics Concern   Not on file  Social History Narrative   Caffeine: 1 cup coffee/day; 1 drink/day   Lives alone   Married   3 grown children   3 dogs   1 cat   Occupation: self employed (septic system)   Activity: T25   Diet: good water, fruits/vegetables daily   Social Determinants of Radio broadcast assistant Strain: Not on file  Food Insecurity: Not on file  Transportation Needs: Not on file  Physical Activity: Not on file  Stress: Not on file  Social Connections: Not on file  Intimate Partner Violence: Not on file    Outpatient Medications Prior to Visit  Medication Sig Dispense Refill   Ascorbic Acid (VITAMIN C) 100 MG tablet Take 100 mg by mouth daily. (Patient not taking: Reported on 03/03/2022)     fluticasone (FLOVENT HFA) 220 MCG/ACT inhaler Inhale 2 puffs into the lungs in the morning and at  bedtime. (Patient not taking: Reported on 03/03/2022) 1 each 0   guaiFENesin-codeine (CHERATUSSIN AC) 100-10 MG/5ML syrup Take 5 mLs by mouth 3 (three) times daily as needed for cough. 120 mL 0   naproxen sodium (ALEVE) 220 MG tablet Take 220 mg by mouth daily as needed. (Patient not taking: Reported on 03/03/2022)     No facility-administered medications prior to visit.    No Known Allergies      Objective:    Physical Exam Constitutional:      General: He is not in acute distress.    Appearance: He is well-developed. He is obese. He is not ill-appearing, toxic-appearing or diaphoretic.  HENT:     Ears:     Comments: Bil slightly impacted cerumen Cardiovascular:     Rate and Rhythm: Normal rate and regular rhythm.     Comments: No carotid bruits  Pulmonary:      Effort: Pulmonary effort is normal.     Breath sounds: Normal breath sounds.  Musculoskeletal:     Right lower leg: No edema.     Left lower leg: No edema.  Neurological:     Mental Status: He is alert. Mental status is at baseline.     Cranial Nerves: No cranial nerve deficit or facial asymmetry.     Sensory: No sensory deficit.     Gait: Gait normal.  Psychiatric:        Mood and Affect: Mood normal.        Speech: Speech normal.        Behavior: Behavior normal.     BP (!) 150/108   Pulse 80   Temp 98.8 F (37.1 C)   Resp 16   Ht 5\' 10"  (1.778 m)   Wt 222 lb 6 oz (100.9 kg)   SpO2 97%   BMI 31.91 kg/m  Wt Readings from Last 3 Encounters:  03/03/22 222 lb 6 oz (100.9 kg)  06/26/21 220 lb (99.8 kg)  02/14/20 217 lb (98.4 kg)     Health Maintenance Due  Topic Date Due   HIV Screening  Never done   Hepatitis C Screening  Never done   Zoster Vaccines- Shingrix (1 of 2) Never done   TETANUS/TDAP  05/12/2018   COVID-19 Vaccine (2 - Pfizer series) 03/23/2020   INFLUENZA VACCINE  12/31/2021    There are no preventive care reminders to display for this patient.  Lab Results  Component Value Date   TSH 1.31 02/18/2011   Lab Results  Component Value Date   WBC 5.1 07/05/2017   HGB 16.5 07/05/2017   HCT 47.2 07/05/2017   MCV 93.1 07/05/2017   PLT 205 07/05/2017   Lab Results  Component Value Date   NA 139 07/05/2017   K 4.1 07/05/2017   CO2 26 07/05/2017   GLUCOSE 107 (H) 07/05/2017   BUN 20 07/05/2017   CREATININE 0.99 07/05/2017   BILITOT 0.8 02/18/2011   ALKPHOS 74 02/18/2011   AST 24 02/18/2011   ALT 20 08/03/2017   PROT 7.5 02/18/2011   ALBUMIN 4.4 02/18/2011   CALCIUM 9.7 07/05/2017   ANIONGAP 11 07/05/2017   GFR 94.47 05/19/2012   No results found for: "HGBA1C"    Assessment & Plan:   Problem List Items Addressed This Visit       Cardiovascular and Mediastinum   Primary hypertension    Start amlodipine 5 mg once daily  Pt to monitor  symptoms, if headache worsens, blurry vision worsens and or slurred  speech, weakness extremities, s/s stroke pt advised to call 911 or go to ER.  Start monitoring your blood pressure daily, around the same time of day, for the next 1 week.  Ensure that you have rested for 30 minutes prior to checking your blood pressure. Record your readings and bring them to your next visit.       Relevant Medications   amLODipine (NORVASC) 5 MG tablet   Other Relevant Orders   CBC   Comprehensive metabolic panel   TSH   Left atrial enlargement    Consider cardiology referral For now will attempt to control htn and pt to f/u with pcp in one week with blood pressure log      Relevant Medications   amLODipine (NORVASC) 5 MG tablet     Other   Chest pressure - Primary    ekg today  nonconcerning for acute concerns Finding of atrial enlargement.        Relevant Orders   EKG 12-Lead (Completed)   CBC   Comprehensive metabolic panel   TSH    Meds ordered this encounter  Medications   amLODipine (NORVASC) 5 MG tablet    Sig: Take 1 tablet (5 mg total) by mouth daily.    Dispense:  90 tablet    Refill:  0    Order Specific Question:   Supervising Provider    Answer:   Ermalene Searing, AMY E [[id number]]    Follow-up: Return in about 1 week (around 03/10/2022) for f/u blood pressure with pcp .    Mort Sawyers, FNP

## 2022-03-03 NOTE — Assessment & Plan Note (Signed)
Consider cardiology referral For now will attempt to control htn and pt to f/u with pcp in one week with blood pressure log

## 2022-03-03 NOTE — Progress Notes (Signed)
Left atrial enlargement on ekg.  Will order echocardiogram for further eval  Claiborne Billings can you please inform pt I am going to order an u/s of his heart just to get a better visual for Dr. Darnell Level?

## 2022-03-03 NOTE — Assessment & Plan Note (Signed)
Start amlodipine 5 mg once daily  Pt to monitor symptoms, if headache worsens, blurry vision worsens and or slurred speech, weakness extremities, s/s stroke pt advised to call 911 or go to ER.  Start monitoring your blood pressure daily, around the same time of day, for the next 1 week.  Ensure that you have rested for 30 minutes prior to checking your blood pressure. Record your readings and bring them to your next visit.

## 2022-03-03 NOTE — Assessment & Plan Note (Signed)
ekg today  nonconcerning for acute concerns Finding of atrial enlargement.

## 2022-03-04 NOTE — Progress Notes (Signed)
Thyroid normal limits CBC normal limits And CMP normal limits Labs are unremarkable

## 2022-03-18 ENCOUNTER — Encounter: Payer: Self-pay | Admitting: Family Medicine

## 2022-03-18 ENCOUNTER — Ambulatory Visit: Payer: BLUE CROSS/BLUE SHIELD | Admitting: Family Medicine

## 2022-03-18 VITALS — BP 132/82 | HR 75 | Temp 98.1°F | Ht 70.0 in | Wt 216.4 lb

## 2022-03-18 DIAGNOSIS — R519 Headache, unspecified: Secondary | ICD-10-CM | POA: Diagnosis not present

## 2022-03-18 DIAGNOSIS — I10 Essential (primary) hypertension: Secondary | ICD-10-CM | POA: Diagnosis not present

## 2022-03-18 DIAGNOSIS — Z23 Encounter for immunization: Secondary | ICD-10-CM | POA: Diagnosis not present

## 2022-03-18 NOTE — Patient Instructions (Addendum)
Flu shot today Let us know in a week if you're not contacted for heart ultrasound.  Continue amlodipine - blood pressures are doing better.  For head symptoms - try claritin over the counter. If no better, could try nasal steroid flonase also over the counter.  Return in 3-4 months for physical.

## 2022-03-18 NOTE — Assessment & Plan Note (Addendum)
Significant improvement on amlodipine 5mg  daily - continue this.  Reviewed healthy diet and lifestyle choices to affect improved BP control. Pending echocardiogram.

## 2022-03-18 NOTE — Progress Notes (Signed)
Patient ID: Christopher Ballard, male    DOB: June 29, 1960, 61 y.o.   MRN: [id number]  This visit was conducted in person.  BP 132/82   Pulse 75   Temp 98.1 F (36.7 C) (Temporal)   Ht 5\' 10"  (1.778 m)   Wt 216 lb 6 oz (98.1 kg)   SpO2 95%   BMI 31.05 kg/m    CC: HTN f/u visit  Subjective:   HPI: Christopher Ballard is a 61 y.o. male presenting on 03/18/2022 for Hypertension (Here for 1 wk f/u, per Tabitha. )   Saw Tabitha early October with increasing head pressure and hypertension - started on amlodipine 5mg  daily. BP initially 180/118 at OV, better on recheck. Prior note, EKG, labs reviewed.   HTN - Compliant with current antihypertensive regimen of amlodipine 5mg  daily, tolerating well. Still feels head pressure feeling. Does check blood pressures at home and brings log - 110-140s/80-90s. No low blood pressure readings or symptoms of dizziness/syncope. Denies HA, CP/tightness, SOB, leg swelling.  Notes vision is worsening - due for eye doctor - upcoming appt at end of the month.   When working in the heat in middle of day feels head heat up. No fever.  He is working on weight loss.   + congestion, rhinorrhea, mild productive cough in am, PNdrainage.  No fevers/chills, dyspnea or wheezing, palpitations, dizziness.  No sore throat, ear or tooth pain.  Notes increased burping on amlodipine.      Relevant past medical, surgical, family and social history reviewed and updated as indicated. Interim medical history since our last visit reviewed. Allergies and medications reviewed and updated. Outpatient Medications Prior to Visit  Medication Sig Dispense Refill   amLODipine (NORVASC) 5 MG tablet Take 1 tablet (5 mg total) by mouth daily. 90 tablet 0   No facility-administered medications prior to visit.     Per HPI unless specifically indicated in ROS section below Review of Systems  Objective:  BP 132/82   Pulse 75   Temp 98.1 F (36.7 C) (Temporal)   Ht 5\' 10"  (1.778 m)    Wt 216 lb 6 oz (98.1 kg)   SpO2 95%   BMI 31.05 kg/m   Wt Readings from Last 3 Encounters:  03/18/22 216 lb 6 oz (98.1 kg)  03/03/22 222 lb 6 oz (100.9 kg)  06/26/21 220 lb (99.8 kg)      Physical Exam Vitals and nursing note reviewed.  Constitutional:      Appearance: Normal appearance. He is not ill-appearing.  HENT:     Head: Normocephalic and atraumatic.     Right Ear: Hearing, tympanic membrane, ear canal and external ear normal. There is no impacted cerumen.     Left Ear: Hearing, tympanic membrane, ear canal and external ear normal. There is no impacted cerumen.     Nose: Mucosal edema and congestion present. No rhinorrhea.     Right Turbinates: Not enlarged or swollen.     Left Turbinates: Not enlarged or swollen.     Right Sinus: No maxillary sinus tenderness or frontal sinus tenderness.     Left Sinus: No maxillary sinus tenderness or frontal sinus tenderness.     Comments: Nasal mucosal inflammation    Mouth/Throat:     Mouth: Mucous membranes are moist.     Pharynx: Oropharynx is clear. No oropharyngeal exudate or posterior oropharyngeal erythema.  Eyes:     Extraocular Movements: Extraocular movements intact.     Conjunctiva/sclera: Conjunctivae normal.  Pupils: Pupils are equal, round, and reactive to light.  Neck:     Thyroid: No thyroid mass or thyromegaly.  Cardiovascular:     Rate and Rhythm: Normal rate and regular rhythm.     Pulses: Normal pulses.     Heart sounds: Normal heart sounds. No murmur heard. Pulmonary:     Effort: Pulmonary effort is normal. No respiratory distress.     Breath sounds: Normal breath sounds. No wheezing, rhonchi or rales.  Musculoskeletal:     Cervical back: Normal range of motion and neck supple. No rigidity.     Right lower leg: No edema.     Left lower leg: No edema.  Lymphadenopathy:     Cervical: No cervical adenopathy.  Skin:    General: Skin is warm and dry.     Findings: No rash.  Neurological:     Mental  Status: He is alert.  Psychiatric:        Mood and Affect: Mood normal.        Behavior: Behavior normal.       Results for orders placed or performed in visit on 03/03/22  CBC  Result Value Ref Range   WBC 5.5 4.0 - 10.5 K/uL   RBC 4.51 4.22 - 5.81 Mil/uL   Platelets 179.0 150.0 - 400.0 K/uL   Hemoglobin 14.8 13.0 - 17.0 g/dL   HCT 16.1 09.6 - 04.5 %   MCV 92.8 78.0 - 100.0 fl   MCHC 35.5 30.0 - 36.0 g/dL   RDW 40.9 81.1 - 91.4 %  Comprehensive metabolic panel  Result Value Ref Range   Sodium 140 135 - 145 mEq/L   Potassium 4.4 3.5 - 5.1 mEq/L   Chloride 104 96 - 112 mEq/L   CO2 30 19 - 32 mEq/L   Glucose, Bld 92 70 - 99 mg/dL   BUN 23 6 - 23 mg/dL   Creatinine, Ser 7.82 0.40 - 1.50 mg/dL   Total Bilirubin 1.1 0.2 - 1.2 mg/dL   Alkaline Phosphatase 81 39 - 117 U/L   AST 19 0 - 37 U/L   ALT 21 0 - 53 U/L   Total Protein 7.0 6.0 - 8.3 g/dL   Albumin 4.5 3.5 - 5.2 g/dL   GFR 95.62 >13.08 mL/min   Calcium 9.7 8.4 - 10.5 mg/dL  TSH  Result Value Ref Range   TSH 1.32 0.35 - 5.50 uIU/mL    Assessment & Plan:  He will return for physical.   Problem List Items Addressed This Visit     Primary hypertension - Primary    Significant improvement on amlodipine 5mg  daily - continue this.  Reviewed healthy diet and lifestyle choices to affect improved BP control. Pending echocardiogram.       Pressure in head    Suspect possible allergic rhinitis component vs sinus inflammation. rec trial OTC antihistamine, discussed possible intranasal steroid if ineffective, with nosebleed precautions.       Other Visit Diagnoses     Need for influenza vaccination       Relevant Orders   Flu Vaccine QUAD 70mo+IM (Fluarix, Fluzone & Alfiuria Quad PF) (Completed)        No orders of the defined types were placed in this encounter.  Orders Placed This Encounter  Procedures   Flu Vaccine QUAD 77mo+IM (Fluarix, Fluzone & Alfiuria Quad PF)    Patient Instructions  Flu shot  today Let 5mo know in a week if you're not contacted for heart ultrasound.  Continue amlodipine - blood pressures are doing better.  For head symptoms - try claritin over the counter. If no better, could try nasal steroid flonase also over the counter.  Return in 3-4 months for physical.   Follow up plan: Return in about 3 months (around 06/18/2022), or if symptoms worsen or fail to improve, for annual exam, prior fasting for blood work.  Ria Bush, MD

## 2022-03-18 NOTE — Assessment & Plan Note (Signed)
Suspect possible allergic rhinitis component vs sinus inflammation. rec trial OTC antihistamine, discussed possible intranasal steroid if ineffective, with nosebleed precautions.

## 2022-05-29 ENCOUNTER — Other Ambulatory Visit: Payer: Self-pay | Admitting: Family

## 2022-05-29 DIAGNOSIS — I10 Essential (primary) hypertension: Secondary | ICD-10-CM

## 2022-06-18 ENCOUNTER — Ambulatory Visit (INDEPENDENT_AMBULATORY_CARE_PROVIDER_SITE_OTHER): Payer: BLUE CROSS/BLUE SHIELD | Admitting: Family Medicine

## 2022-06-18 ENCOUNTER — Encounter: Payer: Self-pay | Admitting: Family Medicine

## 2022-06-18 VITALS — BP 124/78 | HR 63 | Temp 97.3°F | Ht 70.0 in | Wt 209.1 lb

## 2022-06-18 DIAGNOSIS — Z0000 Encounter for general adult medical examination without abnormal findings: Secondary | ICD-10-CM | POA: Diagnosis not present

## 2022-06-18 DIAGNOSIS — Z125 Encounter for screening for malignant neoplasm of prostate: Secondary | ICD-10-CM | POA: Diagnosis not present

## 2022-06-18 DIAGNOSIS — Z1211 Encounter for screening for malignant neoplasm of colon: Secondary | ICD-10-CM

## 2022-06-18 DIAGNOSIS — Z23 Encounter for immunization: Secondary | ICD-10-CM

## 2022-06-18 DIAGNOSIS — I10 Essential (primary) hypertension: Secondary | ICD-10-CM

## 2022-06-18 DIAGNOSIS — Z114 Encounter for screening for human immunodeficiency virus [HIV]: Secondary | ICD-10-CM

## 2022-06-18 DIAGNOSIS — Z13220 Encounter for screening for lipoid disorders: Secondary | ICD-10-CM

## 2022-06-18 DIAGNOSIS — Z1159 Encounter for screening for other viral diseases: Secondary | ICD-10-CM

## 2022-06-18 LAB — POCT URINALYSIS DIPSTICK
Bilirubin, UA: NEGATIVE
Blood, UA: NEGATIVE
Glucose, UA: NEGATIVE
Ketones, UA: NEGATIVE
Leukocytes, UA: NEGATIVE
Nitrite, UA: NEGATIVE
Protein, UA: NEGATIVE
Spec Grav, UA: 1.015 (ref 1.010–1.025)
Urobilinogen, UA: 0.2 E.U./dL
pH, UA: 6 (ref 5.0–8.0)

## 2022-06-18 LAB — LIPID PANEL
Cholesterol: 223 mg/dL — ABNORMAL HIGH (ref 0–200)
HDL: 59.1 mg/dL (ref >39.00)
LDL Cholesterol: 142 mg/dL — ABNORMAL HIGH (ref 0–99)
NonHDL: 163.94
Total CHOL/HDL Ratio: 4
Triglycerides: 109 mg/dL (ref 0.0–149.0)
VLDL: 21.8 mg/dL (ref 0.0–40.0)

## 2022-06-18 LAB — PSA: PSA: 3 ng/mL (ref 0.10–4.00)

## 2022-06-18 MED ORDER — AMLODIPINE BESYLATE 2.5 MG PO TABS: 2.5000 mg | ORAL_TABLET | Freq: Every day | ORAL | 3 refills | Status: DC

## 2022-06-18 NOTE — Assessment & Plan Note (Signed)
Chronic, improved control on amlodipine now with weight loss notes some low BPs - will drop dose to 2.5mg  amlodipine daily.

## 2022-06-18 NOTE — Progress Notes (Signed)
Patient ID: Christopher Ballard, male    DOB: 07-14-1960, 62 y.o.   MRN: [id number]  This visit was conducted in person.  BP 124/78   Pulse 63   Temp (!) 97.3 F (36.3 C) (Temporal)   Ht 5\' 10"  (1.778 m)   Wt 209 lb 2 oz (94.9 kg)   SpO2 100%   BMI 30.01 kg/m    CC: CPE Subjective:   HPI: Christopher Ballard is a 62 y.o. male presenting on 06/18/2022 for Annual Exam   13 lb weight loss over past 2 months -healthier diet. BP runs 100-115/70-80 systolic. No dizziness/lightheadedness noted.  Preventative: COLONOSCOPY WITH PROPOFOL 07/23/2012 - WNL 07/25/2012). Requests iFOB this year. Prostate cancer screening - check yearly PSA Lung cancer screening - not eligible Flu shot - yearly COVID shot - Pfizer 01/2020 x1 Td 2009 Pneumonia shot - not due Shingrix - discussed, start today  Advanced directive discussion -  Seat belt use discussed  Sunscreen use discussed. No changing moles on skin. 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 Interim medical history since our last visit reviewed. Allergies and medications reviewed and updated. Outpatient Medications Prior to Visit  Medication Sig Dispense Refill   amLODipine (NORVASC) 5 MG tablet TAKE 1 TABLET (5 MG TOTAL) BY MOUTH DAILY. 90 tablet 0   No facility-administered medications prior to visit.     Per HPI unless specifically indicated in ROS section below Review of Systems  Constitutional:  Negative for activity change, appetite change, chills, fatigue, fever and unexpected weight change.  HENT:  Negative for hearing loss.   Eyes:  Negative for visual  disturbance.  Respiratory:  Negative for cough, chest tightness, shortness of breath and wheezing.   Cardiovascular:  Negative for chest pain, palpitations and leg swelling.  Gastrointestinal:  Negative for abdominal distention, abdominal pain, blood in stool, constipation, diarrhea, nausea and vomiting.  Genitourinary:  Negative for difficulty urinating and hematuria.  Musculoskeletal:  Negative for arthralgias, myalgias and neck pain.  Skin:  Negative for rash.  Neurological:  Negative for dizziness, seizures, syncope and headaches.  Hematological:  Negative for adenopathy. Does not bruise/bleed easily.  Psychiatric/Behavioral:  Negative for dysphoric mood. The patient is not nervous/anxious.     Objective:  BP 124/78   Pulse 63   Temp (!) 97.3 F (36.3 C) (Temporal)   Ht 5\' 10"  (1.778 m)   Wt 209 lb 2 oz (94.9 kg)   SpO2 100%   BMI 30.01 kg/m   Wt Readings from Last 3 Encounters:  06/18/22 209 lb 2 oz (94.9 kg)  03/18/22 216 lb 6 oz (98.1 kg)  03/03/22 222 lb 6 oz (100.9 kg)      Physical Exam Vitals and nursing note reviewed.  Constitutional:      General: He is not in acute distress.    Appearance: Normal appearance. He is well-developed. He is not ill-appearing.  HENT:     Head: Normocephalic and atraumatic.     Right Ear: Hearing, tympanic membrane, ear canal and external ear normal.     Left Ear: Hearing, tympanic membrane, ear canal and external ear normal.  Eyes:     General: No scleral icterus.    Extraocular Movements: Extraocular movements intact.     Conjunctiva/sclera: Conjunctivae normal.     Pupils: Pupils are equal, round, and reactive to light.  Neck:     Thyroid: No thyroid mass or thyromegaly.  Cardiovascular:     Rate and Rhythm: Normal rate and regular rhythm.     Pulses: Normal pulses.          Radial pulses are 2+ on the right side and 2+ on the left side.     Heart sounds: Normal heart sounds. No murmur heard. Pulmonary:     Effort:  Pulmonary effort is normal. No respiratory distress.     Breath sounds: Normal breath sounds. No wheezing, rhonchi or rales.  Abdominal:     General: Bowel sounds are normal. There is no distension.     Palpations: Abdomen is soft. There is no mass.     Tenderness: There is no abdominal tenderness. There is no guarding or rebound.     Hernia: No hernia is present.  Musculoskeletal:        General: Normal range of motion.     Cervical back: Normal range of motion and neck supple.     Right lower leg: No edema.     Left lower leg: No edema.  Lymphadenopathy:     Cervical: No cervical adenopathy.  Skin:    General: Skin is warm and dry.     Findings: No rash.  Neurological:     General: No focal deficit present.     Mental Status: He is alert and oriented to person, place, and time.  Psychiatric:        Mood and Affect: Mood normal.        Behavior: Behavior normal.        Thought Content: Thought content normal.        Judgment: Judgment normal.       Results for orders placed or performed in visit on 06/18/22  POCT urinalysis dipstick  Result Value Ref Range   Color, UA yellow    Clarity, UA clear    Glucose, UA Negative Negative   Bilirubin, UA negative    Ketones, UA negative    Spec Grav, UA 1.015 1.010 - 1.025   Blood, UA negative    pH, UA 6.0 5.0 - 8.0   Protein, UA Negative Negative   Urobilinogen, UA 0.2 0.2 or 1.0 E.U./dL   Nitrite, UA negative    Leukocytes, UA Negative Negative   Appearance     Odor      Assessment & Plan:   Problem List Items Addressed This Visit     Health maintenance examination - Primary (Chronic)    Preventative protocols reviewed and updated unless pt declined. Discussed healthy diet and lifestyle.       Relevant Orders   POCT URINALYSIS DIP (CLINITEK)   Primary hypertension    Chronic, improved control on amlodipine now with weight loss notes some low BPs - will drop dose to 2.5mg  amlodipine daily.       Relevant  Medications   amLODipine (NORVASC) 2.5 MG tablet   Other Relevant Orders   POCT urinalysis dipstick (Completed)   Other Visit Diagnoses     Need for hepatitis C screening test       Relevant Orders   Hepatitis C antibody   Special screening for malignant neoplasm of prostate       Relevant Orders  PSA   Lipid screening       Relevant Orders   Lipid panel   Encounter for screening for HIV       Relevant Orders   HIV Antibody (routine testing w rflx)   Special screening for malignant neoplasms, colon       Relevant Orders   Fecal occult blood, imunochemical   Need for shingles vaccine       Relevant Orders   Varicella-zoster vaccine IM (Completed)        Meds ordered this encounter  Medications   amLODipine (NORVASC) 2.5 MG tablet    Sig: Take 1 tablet (2.5 mg total) by mouth daily.    Dispense:  90 tablet    Refill:  3    Orders Placed This Encounter  Procedures   Fecal occult blood, imunochemical    Standing Status:   Future    Standing Expiration Date:   06/19/2023   Varicella-zoster vaccine IM   Lipid panel   PSA   Hepatitis C antibody   HIV Antibody (routine testing w rflx)   POCT URINALYSIS DIP (CLINITEK)   POCT urinalysis dipstick    Patient Instructions  Shingrix shot today - return in 2-6 months for nurse visit for 2nd shot.  Drop amlodipine to 2.5mg  daily - new dose at pharmacy.  Pass by lab to pick up stool kit.  Labs today.  You are doing well today.  Return as needed or in 1 year for next physical.   Follow up plan: Return in about 1 year (around 06/19/2023) for annual exam, prior fasting for blood work.  Ria Bush, MD

## 2022-06-18 NOTE — Assessment & Plan Note (Signed)
Preventative protocols reviewed and updated unless pt declined. Discussed healthy diet and lifestyle.  

## 2022-06-18 NOTE — Patient Instructions (Addendum)
Shingrix shot today - return in 2-6 months for nurse visit for 2nd shot.  Drop amlodipine to 2.5mg  daily - new dose at pharmacy.  Pass by lab to pick up stool kit.  Labs today.  You are doing well today.  Return as needed or in 1 year for next physical.

## 2022-06-19 LAB — HIV ANTIBODY (ROUTINE TESTING W REFLEX): HIV 1&2 Ab, 4th Generation: NONREACTIVE

## 2022-06-19 LAB — HEPATITIS C ANTIBODY: Hepatitis C Ab: NONREACTIVE

## 2022-07-01 ENCOUNTER — Other Ambulatory Visit: Payer: Self-pay | Admitting: Radiology

## 2022-07-01 DIAGNOSIS — Z1211 Encounter for screening for malignant neoplasm of colon: Secondary | ICD-10-CM

## 2022-07-02 LAB — FECAL OCCULT BLOOD, IMMUNOCHEMICAL: Fecal Occult Bld: NEGATIVE

## 2022-08-13 ENCOUNTER — Encounter: Payer: Self-pay | Admitting: Internal Medicine

## 2022-11-18 ENCOUNTER — Ambulatory Visit (INDEPENDENT_AMBULATORY_CARE_PROVIDER_SITE_OTHER): Payer: BLUE CROSS/BLUE SHIELD

## 2022-11-18 DIAGNOSIS — Z23 Encounter for immunization: Secondary | ICD-10-CM

## 2022-11-18 NOTE — Progress Notes (Signed)
Per orders of Dr. Eustaquio Boyden, injection of shingrix given by Lewanda Rife in left deltoid. Patient tolerated injection well.Christopher Ballard

## 2023-05-13 ENCOUNTER — Ambulatory Visit: Payer: BLUE CROSS/BLUE SHIELD | Admitting: Internal Medicine

## 2023-05-13 ENCOUNTER — Encounter: Payer: Self-pay | Admitting: Internal Medicine

## 2023-05-13 VITALS — BP 112/74 | HR 77 | Temp 99.5°F | Ht 70.0 in | Wt 211.0 lb

## 2023-05-13 DIAGNOSIS — N452 Orchitis: Secondary | ICD-10-CM | POA: Insufficient documentation

## 2023-05-13 DIAGNOSIS — J069 Acute upper respiratory infection, unspecified: Secondary | ICD-10-CM | POA: Diagnosis not present

## 2023-05-13 MED ORDER — CEFTRIAXONE SODIUM 1 G IJ SOLR
1.0000 g | Freq: Once | INTRAMUSCULAR | Status: AC
  Administered 2023-05-13: 1 g via INTRAMUSCULAR

## 2023-05-13 MED ORDER — DOXYCYCLINE HYCLATE 100 MG PO TABS: 100.0000 mg | ORAL_TABLET | Freq: Two times a day (BID) | ORAL | 0 refills | Status: DC

## 2023-05-13 MED ORDER — HYDROCOD POLI-CHLORPHE POLI ER 10-8 MG/5ML PO SUER: 5.0000 mL | Freq: Every evening | ORAL | 0 refills | Status: DC | PRN

## 2023-05-13 NOTE — Assessment & Plan Note (Addendum)
Seems viral but has abnormal TM Discussed symptom relief---Rx for tussionex for night Is getting antibiotic anyway

## 2023-05-13 NOTE — Addendum Note (Signed)
Addended by: Eual Fines on: 05/13/2023 10:22 AM   Modules accepted: Orders

## 2023-05-13 NOTE — Progress Notes (Signed)
Subjective:    Patient ID: MILFORD STROEBEL, male    DOB: 20-Nov-1960, 62 y.o.   MRN: [id number]  HPI Here due to respiratory illness and testicular pain  Had bad cough yesterday-or may have been mild for 1-2 days Then got cold chills and ?fever Went home to bed at his normal time No SOB No sputum No headache, ear pain, sore throat  Tried some vitamin C, zinc and nyquil Feeling a little better today  Also with 2 weeks of pain in left testicle Started after lifting heavy boxes Swelled up and is hard--and tender Tried advil---seemed to help (not daily)  Current Outpatient Medications on File Prior to Visit  Medication Sig Dispense Refill   amLODipine (NORVASC) 2.5 MG tablet Take 1 tablet (2.5 mg total) by mouth daily. 90 tablet 3   No current facility-administered medications on file prior to visit.    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   Bicipital tendinitis of left shoulder 10/2014   COVID-19 virus infection 02/14/2020   Dental crowns present    GERD (gastroesophageal reflux disease)    Impingement syndrome of left shoulder region 10/2014    Past Surgical History:  Procedure Laterality Date   BICEPT TENODESIS Left 10/19/2014   Procedure: BICEPS TENODESIS;  Surgeon: Mckinley Jewel, MD;  Location: [facility];  Service: Orthopedics;  Laterality: Left;   CARDIOVASCULAR STRESS TEST  07/2017   low risk study (Dr End)   COLONOSCOPY WITH PROPOFOL  07/23/2012   WNL Leone Payor)   WISDOM TOOTH EXTRACTION  1987    Family History  Problem Relation Age of Onset   Seizures Father    Heart disease Neg Hx    Stroke Neg Hx    Diabetes Neg Hx    Cancer Neg Hx     Social History   Socioeconomic History   Marital status: Divorced    Spouse name: Not on file   Number of children: 3   Years of education: Not on file   Highest education level: Not on file  Occupational History    Occupation: Self-employed  Tobacco Use   Smoking status: Never   Smokeless tobacco: Current    Types: Chew  Substance and Sexual Activity   Alcohol use: Yes    Alcohol/week: 5.0 standard drinks of alcohol    Types: 5 Cans of beer per week   Drug use: No   Sexual activity: Not on file  Other Topics Concern   Not on file  Social History Narrative   Caffeine: 1 cup coffee/day; 1 drink/day   Lives alone   Married   3 grown children   3 dogs   1 cat   Occupation: self employed ([employer] system)   Activity: [employer]   Diet: good water, fruits/vegetables daily   Social Determinants of Corporate investment banker Strain: Not on file  Food Insecurity: Not on file  Transportation Needs: Not on file  Physical Activity: Not on file  Stress: Not on file  Social Connections: Not on file  Intimate Partner Violence: Not on file   Review of Systems No N/V Voiding okay Appetite is fine Did have sex this weekend--but some pain. Did ejaculate. Monogamous for 12 years     Objective:   Physical Exam Constitutional:      Appearance: Normal appearance.  HENT:     Ears:     Comments: Left TM occluded  with cerumen Right TM looks red and dull    Mouth/Throat:     Pharynx: No oropharyngeal exudate or posterior oropharyngeal erythema.  Pulmonary:     Effort: Pulmonary effort is normal.     Breath sounds: Normal breath sounds. No wheezing or rales.  Musculoskeletal:     Cervical back: Neck supple.  Lymphadenopathy:     Cervical: No cervical adenopathy.  Neurological:     Mental Status: He is alert.            Assessment & Plan:

## 2023-05-13 NOTE — Assessment & Plan Note (Signed)
Presumed infection Happened after heavy lifting--but no hernia and is markedly inflamed Will give rocephin 1gm IM Doxycycline 100 bid x 10 days If not much better in a few days--will set up with urology

## 2023-05-13 NOTE — Patient Instructions (Signed)
Let me know if your testes is not much better by Monday

## 2023-07-10 ENCOUNTER — Ambulatory Visit: Payer: BLUE CROSS/BLUE SHIELD | Admitting: Nurse Practitioner

## 2023-07-10 VITALS — BP 120/78 | HR 87 | Temp 98.9°F | Ht 70.0 in | Wt 216.4 lb

## 2023-07-10 DIAGNOSIS — R051 Acute cough: Secondary | ICD-10-CM | POA: Diagnosis not present

## 2023-07-10 DIAGNOSIS — R52 Pain, unspecified: Secondary | ICD-10-CM | POA: Diagnosis not present

## 2023-07-10 DIAGNOSIS — J029 Acute pharyngitis, unspecified: Secondary | ICD-10-CM | POA: Diagnosis not present

## 2023-07-10 DIAGNOSIS — R509 Fever, unspecified: Secondary | ICD-10-CM | POA: Diagnosis not present

## 2023-07-10 DIAGNOSIS — J101 Influenza due to other identified influenza virus with other respiratory manifestations: Secondary | ICD-10-CM | POA: Insufficient documentation

## 2023-07-10 DIAGNOSIS — H6123 Impacted cerumen, bilateral: Secondary | ICD-10-CM | POA: Insufficient documentation

## 2023-07-10 DIAGNOSIS — R12 Heartburn: Secondary | ICD-10-CM | POA: Insufficient documentation

## 2023-07-10 LAB — POCT RAPID STREP A (OFFICE): Rapid Strep A Screen: NEGATIVE

## 2023-07-10 LAB — POCT FLU A/B STATUS
Influenza A, POC: POSITIVE — AB
Influenza B, POC: NEGATIVE

## 2023-07-10 LAB — POC COVID19 BINAXNOW: SARS Coronavirus 2 Ag: NEGATIVE

## 2023-07-10 MED ORDER — BENZONATATE 200 MG PO CAPS: 200.0000 mg | ORAL_CAPSULE | Freq: Three times a day (TID) | ORAL | 0 refills | Status: DC | PRN

## 2023-07-10 MED ORDER — OMEPRAZOLE 20 MG PO CPDR: 20.0000 mg | DELAYED_RELEASE_CAPSULE | Freq: Every day | ORAL | 0 refills | Status: DC

## 2023-07-10 NOTE — Patient Instructions (Signed)
 Nice to see you today You tested positive for the flu Stay home if you have a fever. You can go back to work if you have been fever free for 24 hours without the use of tylenol  or ibuprofen Follow up if you do not continue

## 2023-07-10 NOTE — Assessment & Plan Note (Signed)
 Will trial patient with omeprazole  20 mg daily.  I do think his early morning dry heaves are secondary to acid reflux.  If no improvement he will follow-up with primary care provider

## 2023-07-10 NOTE — Assessment & Plan Note (Signed)
 Flu and COVID test in office.  Tessalon  Perles 200 mg 3 times daily as needed.  Patient to continue using NyQuil at night to help rest

## 2023-07-10 NOTE — Progress Notes (Signed)
 Acute Office Visit  Subjective:     Patient ID: Christopher Ballard, male    DOB: Oct 15, 1960, 63 y.o.   MRN: [id number]  Chief Complaint  Patient presents with   Cough    Pt complains of bodyaches, chills, fever, nausea, and dry cough that started Monday. Pt complains of ear pain. sore throat started yesterday.      Patient is in today for sick sympotms with a history of atrial enlargement, HTN, ED Yaphet is a patient of Dr. Rilla  Covid vaccine: Flu vaccine : not up to date  Symptoms started on Monday  States that his brother in law was sick last Saturday and everyone that was around him got sick He has been using advil and nyquill. Helped a little bit, He is able to sleep    Patient does mention that prior to getting sick he had episodes of where she will gag but nothing will come up in the mornings.  He is unsure if he has heartburn or not but he has not tried any treatment.  Review of Systems  Constitutional:  Positive for chills, fever (improved some) and malaise/fatigue.  HENT:  Positive for ear discharge, ear pain and sore throat.   Respiratory:  Positive for cough. Negative for sputum production and shortness of breath.   Gastrointestinal:  Negative for abdominal pain.  Musculoskeletal:  Positive for myalgias (improving).  Neurological:  Negative for dizziness and headaches.        Objective:    BP 120/78   Pulse 87   Temp 98.9 F (37.2 C) (Oral)   Ht 5' 10 (1.778 m)   Wt 216 lb 6.4 oz (98.2 kg)   SpO2 95%   BMI 31.05 kg/m    Physical Exam Constitutional:      Appearance: Normal appearance.  HENT:     Right Ear: Ear canal and external ear normal. There is impacted cerumen.     Left Ear: Ear canal and external ear normal. There is impacted cerumen.     Mouth/Throat:     Mouth: Mucous membranes are moist.     Pharynx: Oropharynx is clear.  Cardiovascular:     Rate and Rhythm: Normal rate and regular rhythm.     Heart sounds: Normal heart sounds.   Pulmonary:     Effort: Pulmonary effort is normal.     Breath sounds: Normal breath sounds.  Abdominal:     General: Bowel sounds are normal. There is no distension.     Palpations: There is no mass.     Tenderness: There is no abdominal tenderness.     Hernia: No hernia is present.  Lymphadenopathy:     Cervical: No cervical adenopathy.  Neurological:     Mental Status: He is alert.     Results for orders placed or performed in visit on 07/10/23  POC COVID-19  Result Value Ref Range   SARS Coronavirus 2 Ag Negative Negative  POCT Flu A & B Status  Result Value Ref Range   Influenza A, POC Positive (A) Negative   Influenza B, POC Negative Negative  Rapid Strep A  Result Value Ref Range   Rapid Strep A Screen Negative Negative        Assessment & Plan:   Problem List Items Addressed This Visit       Respiratory   Influenza A   Flu test positive in office.  Patient outside the window for acute antiviral treatment.  Supportive care follow-up if  he does not continue to improve.  Strep and COVID test negative in office.        Nervous and Auditory   Bilateral impacted cerumen   Verbal consent obtained.  Patient was prepped per office policy.  A mixture of water and hydroperoxide was used.  Bilateral ears were irrigated.  Patient tolerated procedure well.  Canals and TMs within normal limits postprocedure      Relevant Orders   Ear Lavage     Other   Fever and chills - Primary   Flu and COVID test in office.  Rest drink plenty of fluids over-the-counter analgesics as directed      Relevant Orders   POC COVID-19 (Completed)   POCT Flu A & B Status (Completed)   Body aches   Flu and COVID test in office.  Patient can rest drink plenty of fluids over-the-counter analgesics as directed      Relevant Orders   POC COVID-19 (Completed)   POCT Flu A & B Status (Completed)   Acute cough   Flu and COVID test in office.  Tessalon  Perles 200 mg 3 times daily as  needed.  Patient to continue using NyQuil at night to help rest      Relevant Medications   benzonatate  (TESSALON ) 200 MG capsule   Other Relevant Orders   POC COVID-19 (Completed)   POCT Flu A & B Status (Completed)   Sore throat   COVID flu and strep test in office.  Patient can drink plenty of water rest and over-the-counter analgesics as needed.      Relevant Orders   POC COVID-19 (Completed)   POCT Flu A & B Status (Completed)   Rapid Strep A (Completed)   Heartburn   Will trial patient with omeprazole  20 mg daily.  I do think his early morning dry heaves are secondary to acid reflux.  If no improvement he will follow-up with primary care provider      Relevant Medications   omeprazole  (PRILOSEC) 20 MG capsule    Meds ordered this encounter  Medications   omeprazole  (PRILOSEC) 20 MG capsule    Sig: Take 1 capsule (20 mg total) by mouth daily.    Dispense:  30 capsule    Refill:  0    Supervising Provider:   RANDEEN HARDY A [[id number]]   benzonatate  (TESSALON ) 200 MG capsule    Sig: Take 1 capsule (200 mg total) by mouth 3 (three) times daily as needed for cough.    Dispense:  21 capsule    Refill:  0    Supervising Provider:   RANDEEN HARDY A [[id number]]    Return if symptoms worsen or fail to improve.  Adina Crandall, NP

## 2023-07-10 NOTE — Assessment & Plan Note (Signed)
 COVID flu and strep test in office.  Patient can drink plenty of water rest and over-the-counter analgesics as needed.

## 2023-07-10 NOTE — Assessment & Plan Note (Signed)
 Flu and COVID test in office.  Rest drink plenty of fluids over-the-counter analgesics as directed

## 2023-07-10 NOTE — Assessment & Plan Note (Signed)
 Flu and COVID test in office.  Patient can rest drink plenty of fluids over-the-counter analgesics as directed

## 2023-07-10 NOTE — Assessment & Plan Note (Addendum)
 Flu test positive in office.  Patient outside the window for acute antiviral treatment.  Supportive care follow-up if he does not continue to improve.  Strep and COVID test negative in office.

## 2023-07-10 NOTE — Assessment & Plan Note (Signed)
 Verbal consent obtained.  Patient was prepped per office policy.  A mixture of water and hydroperoxide was used.  Bilateral ears were irrigated.  Patient tolerated procedure well.  Canals and TMs within normal limits postprocedure

## 2023-08-06 ENCOUNTER — Other Ambulatory Visit: Payer: Self-pay | Admitting: Family Medicine

## 2023-08-06 DIAGNOSIS — I10 Essential (primary) hypertension: Secondary | ICD-10-CM

## 2023-08-07 NOTE — Telephone Encounter (Signed)
 E-scribed refill.  Plz schedule CPE and fasting lab (no food/drink- except water and/or blk coffee 5 hrs prior) for additional refills.

## 2023-08-07 NOTE — Telephone Encounter (Signed)
 Patient has been scheduled

## 2023-08-07 NOTE — Telephone Encounter (Signed)
 Noted.

## 2023-08-10 ENCOUNTER — Other Ambulatory Visit: Payer: Self-pay | Admitting: Nurse Practitioner

## 2023-08-10 DIAGNOSIS — R12 Heartburn: Secondary | ICD-10-CM

## 2023-08-10 NOTE — Telephone Encounter (Signed)
 Can we see if the omeprazole was helpful. If so I will relinquish further refills to PCP

## 2023-08-11 NOTE — Telephone Encounter (Signed)
Unable to reach patient.Unable to leave message, mailbox full.

## 2023-08-31 NOTE — Telephone Encounter (Signed)
 Called and spoke with patient, he states the omeprazole prescription was helpful for his heartburn and he would like a refill. He has been out of the medication for several days now.

## 2023-09-01 ENCOUNTER — Encounter: Admitting: Family Medicine

## 2023-09-02 ENCOUNTER — Encounter: Payer: Self-pay | Admitting: Family Medicine

## 2023-10-27 NOTE — Progress Notes (Unsigned)
     Christopher Knoop T. Hellena Pridgen, MD, CAQ Sports Medicine [facility] [address] [place], [address]  Phone: [phone]  FAX: [phone]  Christopher Ballard - 63 y.o. male  MRN [id number]  Date of Birth: [birth year]  Date: 10/29/2023  PCP: Christopher Crick, MD  Referral: Christopher Crick, MD  No chief complaint on file.  Subjective:   Christopher Ballard is a 63 y.o. very pleasant male patient with There is no height or weight on file to calculate BMI. who presents with the following:  He is a patient of Dr. Mariam Ballard, who presents for blood pressure check.    Review of Systems is noted in the HPI, as appropriate  Objective:   There were no vitals taken for this visit.  GEN: No acute distress; alert,appropriate. PULM: Breathing comfortably in no respiratory distress PSYCH: Normally interactive.   Laboratory and Imaging Data:  Assessment and Plan:   ***

## 2023-10-29 ENCOUNTER — Telehealth: Payer: Self-pay

## 2023-10-29 ENCOUNTER — Ambulatory Visit: Admitting: Family Medicine

## 2023-10-29 ENCOUNTER — Encounter: Payer: Self-pay | Admitting: Family Medicine

## 2023-10-29 VITALS — BP 134/88 | HR 66 | Temp 98.0°F | Ht 70.0 in | Wt 214.5 lb

## 2023-10-29 DIAGNOSIS — I10 Essential (primary) hypertension: Secondary | ICD-10-CM

## 2023-10-29 DIAGNOSIS — M545 Low back pain, unspecified: Secondary | ICD-10-CM

## 2023-10-29 LAB — POC URINALSYSI DIPSTICK (AUTOMATED)
Bilirubin, UA: NEGATIVE
Blood, UA: NEGATIVE
Glucose, UA: NEGATIVE
Ketones, UA: NEGATIVE
Leukocytes, UA: NEGATIVE
Nitrite, UA: NEGATIVE
Protein, UA: NEGATIVE
Spec Grav, UA: 1.015 (ref 1.010–1.025)
Urobilinogen, UA: 0.2 U/dL
pH, UA: 6.5 (ref 5.0–8.0)

## 2023-10-29 MED ORDER — TIZANIDINE HCL 4 MG PO TABS: 4.0000 mg | ORAL_TABLET | Freq: Every evening | ORAL | 1 refills | Status: DC | PRN

## 2023-10-29 MED ORDER — AMLODIPINE BESYLATE 5 MG PO TABS: 5.0000 mg | ORAL_TABLET | Freq: Every day | ORAL | 1 refills | Status: DC

## 2023-10-29 NOTE — Telephone Encounter (Signed)
 Spoke with pt scheduling CPE on 01/11/24 at 12:00 and fasting lab visit on 01/04/24 at 7:45. Fyi to Dr Crissie Dome.

## 2023-10-29 NOTE — Patient Instructions (Signed)
Alleve 2 tabs by mouth two times a day over the counter: Take at least for 2 - 3 weeks. This is equal to a prescripton strength dose (GENERIC CHEAPER EQUIVALENT IS NAPROXEN SODIUM)  

## 2023-10-29 NOTE — Telephone Encounter (Signed)
 Pt needs an appointment with Dr. Mariam Shingles.  Last seen by Dr. Crissie Dome for an appointment was 06/18/2022, over 1 year ago.  Has been in office for acute/sick visits.

## 2023-10-29 NOTE — Telephone Encounter (Signed)
 Pt stated that that no reason for him to come in not his faught dr Crissie Dome didn't have time to see him and somebody else did pt stated that Dr Crissie Dome needs to talk to the Dr that saw him today theres no need for him to come back here

## 2023-11-12 ENCOUNTER — Other Ambulatory Visit: Payer: Self-pay | Admitting: Family Medicine

## 2023-11-12 DIAGNOSIS — I10 Essential (primary) hypertension: Secondary | ICD-10-CM

## 2023-11-12 NOTE — Telephone Encounter (Signed)
 Too soon. Rx sent 10/29/23, #90/1 refill to [facility].   Request declined.

## 2024-01-03 ENCOUNTER — Other Ambulatory Visit: Payer: Self-pay | Admitting: Family Medicine

## 2024-01-03 DIAGNOSIS — I10 Essential (primary) hypertension: Secondary | ICD-10-CM

## 2024-01-03 DIAGNOSIS — Z125 Encounter for screening for malignant neoplasm of prostate: Secondary | ICD-10-CM

## 2024-01-04 ENCOUNTER — Other Ambulatory Visit (INDEPENDENT_AMBULATORY_CARE_PROVIDER_SITE_OTHER)

## 2024-01-04 DIAGNOSIS — Z125 Encounter for screening for malignant neoplasm of prostate: Secondary | ICD-10-CM

## 2024-01-04 DIAGNOSIS — I10 Essential (primary) hypertension: Secondary | ICD-10-CM | POA: Diagnosis not present

## 2024-01-04 LAB — COMPREHENSIVE METABOLIC PANEL WITH GFR
ALT: 16 U/L (ref 0–53)
AST: 18 U/L (ref 0–37)
Albumin: 4.3 g/dL (ref 3.5–5.2)
Alkaline Phosphatase: 75 U/L (ref 39–117)
BUN: 17 mg/dL (ref 6–23)
CO2: 28 meq/L (ref 19–32)
Calcium: 9.2 mg/dL (ref 8.4–10.5)
Chloride: 104 meq/L (ref 96–112)
Creatinine, Ser: 1 mg/dL (ref 0.40–1.50)
GFR: 80.48 mL/min (ref >60.00)
Glucose, Bld: 143 mg/dL — ABNORMAL HIGH (ref 70–99)
Potassium: 4.3 meq/L (ref 3.5–5.1)
Sodium: 140 meq/L (ref 135–145)
Total Bilirubin: 1 mg/dL (ref 0.2–1.2)
Total Protein: 6.4 g/dL (ref 6.0–8.3)

## 2024-01-04 LAB — LIPID PANEL
Cholesterol: 186 mg/dL (ref 0–200)
HDL: 52.3 mg/dL (ref >39.00)
LDL Cholesterol: 117 mg/dL — ABNORMAL HIGH (ref 0–99)
NonHDL: 133.75
Total CHOL/HDL Ratio: 4
Triglycerides: 84 mg/dL (ref 0.0–149.0)
VLDL: 16.8 mg/dL (ref 0.0–40.0)

## 2024-01-04 LAB — PSA: PSA: 2.71 ng/mL (ref 0.10–4.00)

## 2024-01-07 ENCOUNTER — Ambulatory Visit: Payer: Self-pay | Admitting: Family Medicine

## 2024-01-11 ENCOUNTER — Encounter: Payer: Self-pay | Admitting: Family Medicine

## 2024-01-11 ENCOUNTER — Ambulatory Visit (INDEPENDENT_AMBULATORY_CARE_PROVIDER_SITE_OTHER): Admitting: Family Medicine

## 2024-01-11 VITALS — BP 122/88 | HR 73 | Temp 98.3°F | Ht 70.0 in | Wt 216.4 lb

## 2024-01-11 DIAGNOSIS — Z1211 Encounter for screening for malignant neoplasm of colon: Secondary | ICD-10-CM

## 2024-01-11 DIAGNOSIS — Z23 Encounter for immunization: Secondary | ICD-10-CM

## 2024-01-11 DIAGNOSIS — B079 Viral wart, unspecified: Secondary | ICD-10-CM | POA: Diagnosis not present

## 2024-01-11 DIAGNOSIS — Z0001 Encounter for general adult medical examination with abnormal findings: Secondary | ICD-10-CM

## 2024-01-11 DIAGNOSIS — E785 Hyperlipidemia, unspecified: Secondary | ICD-10-CM | POA: Insufficient documentation

## 2024-01-11 DIAGNOSIS — N529 Male erectile dysfunction, unspecified: Secondary | ICD-10-CM | POA: Diagnosis not present

## 2024-01-11 DIAGNOSIS — I10 Essential (primary) hypertension: Secondary | ICD-10-CM | POA: Diagnosis not present

## 2024-01-11 MED ORDER — AMLODIPINE BESYLATE 5 MG PO TABS: 5.0000 mg | ORAL_TABLET | Freq: Every day | ORAL | 3 refills | Status: AC

## 2024-01-11 MED ORDER — SILDENAFIL CITRATE 100 MG PO TABS: 50.0000 mg | ORAL_TABLET | Freq: Every day | ORAL | 6 refills | Status: AC | PRN

## 2024-01-11 NOTE — Assessment & Plan Note (Signed)
Chronic, stable. Continue amlodipine 5mg daily. 

## 2024-01-11 NOTE — Assessment & Plan Note (Addendum)
 Chronic, mild.  Actually improved cholesterol control noted over the past year, without significant diet changes.  Reviewed ASCVD risk with patient.  Discussed cardiac CT - he is interested so will order.  The 10-year ASCVD risk score (Arnett DK, et al., 2019) is: 9.9%   Values used to calculate the score:     Age: 63 years     Clincally relevant sex: Male     Is Non-Hispanic African American: No     Diabetic: No     Tobacco smoker: No     Systolic Blood Pressure: 122 mmHg     Is BP treated: Yes     HDL Cholesterol: 52.3 mg/dL     Total Cholesterol: 186 mg/dL

## 2024-01-11 NOTE — Assessment & Plan Note (Addendum)
 Preventative protocols reviewed and updated unless pt declined. Discussed healthy diet and lifestyle.  Order cologuard.  He was not fasting with latest labs - done after breakfast.

## 2024-01-11 NOTE — Patient Instructions (Addendum)
 Tetanus and whooping cough shot , as well as pneumonia shots today Lesion to skin of left arm treated with liquid nitrogen today. May try salicylic acid (compound W) as needed over the counter for wart treatment. Return for re-treatment if needed.  I will order heart CT to be done at [facility] outpatient imaging center.  May price out viagra  50-100mg  as needed for relations. Return as needed or in 1 year for next physical

## 2024-01-11 NOTE — Progress Notes (Signed)
 Ph: [phone] Fax: [phone]   Patient ID: Christopher Ballard, male    DOB: [birth year], 63 y.o.   MRN: [id number]  This visit was conducted in person.  BP 122/88   Pulse 73   Temp 98.3 F (36.8 C) (Oral)   Ht 5' 10 (1.778 m)   Wt 216 lb 6 oz (98.1 kg)   SpO2 94%   BMI 31.05 kg/m    CC: CPE Subjective:   HPI: Christopher Ballard is a 63 y.o. male presenting on 01/11/2024 for Annual Exam   HTN continues amlodipine  5mg  - home readings well controlled.   Would like wart evaluated to L upper medial arm. No other warts. Few small axillary skin tags.   Notes ongoing difficulty with erectile dysfunction - trouble maintaining erection. Interested in viagra  medication.   Preventative: COLONOSCOPY WITH PROPOFOL  07/23/2012 - WNL [place]en).  iFOB normal 06/2022. Requests Cologuard this year.  Prostate cancer screening - check yearly PSA Lung cancer screening - not eligible Flu shot - yearly COVID shot - Pfizer 01/2020 x1 Td 2009, Tdap today Prevnar-20 - today Shingrix  - 06/2022, 11/2022 Advanced directive discussion -  Seat belt use discussed  Sunscreen use discussed. No changing moles on skin.  Sleep - averaging 7 hours/night Non smoker Alcohol - 2-3 beers/wk Dentist - has not seen - brushes teeth daily Eye exam - yearly    Caffeine: 1 cup coffee/day Lives alone  3 grown children  Occupation: self employed (septic system)  Activity: active at work, enjoys Loss adjuster, chartered Diet: good water, fruits/vegetables daily      Relevant past medical, surgical, family and social history reviewed and updated as indicated. Interim medical history since our last visit reviewed. Allergies and medications reviewed and updated. Outpatient Medications Prior to Visit  Medication Sig Dispense Refill   amLODipine  (NORVASC ) 5 MG tablet Take 1 tablet (5 mg total) by mouth daily. 90 tablet 1   tiZANidine  (ZANAFLEX ) 4 MG tablet Take 1 tablet (4 mg total) by mouth at bedtime as needed  for muscle spasms. 30 tablet 1   No facility-administered medications prior to visit.     Per HPI unless specifically indicated in ROS section below Review of Systems  Constitutional:  Negative for activity change, appetite change, chills, fatigue, fever and unexpected weight change.  HENT:  Negative for hearing loss.   Eyes:  Negative for visual disturbance.  Respiratory:  Negative for cough, chest tightness, shortness of breath and wheezing.   Cardiovascular:  Negative for chest pain, palpitations and leg swelling.  Gastrointestinal:  Negative for abdominal distention, abdominal pain, blood in stool, constipation, diarrhea, nausea and vomiting.  Genitourinary:  Negative for difficulty urinating and hematuria.  Musculoskeletal:  Negative for arthralgias, myalgias and neck pain.  Skin:  Negative for rash.  Neurological:  Negative for dizziness, seizures, syncope and headaches.  Hematological:  Negative for adenopathy. Does not bruise/bleed easily.  Psychiatric/Behavioral:  Negative for dysphoric mood. The patient is not nervous/anxious.     Objective:  BP 122/88   Pulse 73   Temp 98.3 F (36.8 C) (Oral)   Ht 5' 10 (1.778 m)   Wt 216 lb 6 oz (98.1 kg)   SpO2 94%   BMI 31.05 kg/m   Wt Readings from Last 3 Encounters:  01/11/24 216 lb 6 oz (98.1 kg)  10/29/23 214 lb 8 oz (97.3 kg)  07/10/23 216 lb 6.4 oz (98.2 kg)      Physical Exam Vitals and nursing note reviewed.  Constitutional:      General: He is not in acute distress.    Appearance: Normal appearance. He is well-developed. He is not ill-appearing.  HENT:     Head: Normocephalic and atraumatic.     Right Ear: Hearing, tympanic membrane, ear canal and external ear normal.     Left Ear: Hearing, tympanic membrane, ear canal and external ear normal.     Mouth/Throat:     Mouth: Mucous membranes are moist.     Pharynx: Oropharynx is clear. No oropharyngeal exudate or posterior oropharyngeal erythema.  Eyes:      General: No scleral icterus.    Extraocular Movements: Extraocular movements intact.     Conjunctiva/sclera: Conjunctivae normal.     Pupils: Pupils are equal, round, and reactive to light.  Neck:     Thyroid : No thyroid  mass or thyromegaly.  Cardiovascular:     Rate and Rhythm: Normal rate and regular rhythm.     Pulses: Normal pulses.          Radial pulses are 2+ on the right side and 2+ on the left side.     Heart sounds: Normal heart sounds. No murmur heard. Pulmonary:     Effort: Pulmonary effort is normal. No respiratory distress.     Breath sounds: Normal breath sounds. No wheezing, rhonchi or rales.  Abdominal:     General: Bowel sounds are normal. There is no distension.     Palpations: Abdomen is soft. There is no mass.     Tenderness: There is no abdominal tenderness. There is no guarding or rebound.     Hernia: No hernia is present.  Musculoskeletal:        General: Normal range of motion.     Cervical back: Normal range of motion and neck supple.     Right lower leg: No edema.     Left lower leg: No edema.  Lymphadenopathy:     Cervical: No cervical adenopathy.  Skin:    General: Skin is warm and dry.     Findings: Lesion present. No rash.     Comments:  Flesh colored verrucous filiform small growth to left medial upper distal arm  Neurological:     General: No focal deficit present.     Mental Status: He is alert and oriented to person, place, and time.  Psychiatric:        Mood and Affect: Mood normal.        Behavior: Behavior normal.        Thought Content: Thought content normal.        Judgment: Judgment normal.       Results for orders placed or performed in visit on 01/04/24  PSA   Collection Time: 01/04/24  7:44 AM  Result Value Ref Range   PSA 2.71 0.10 - 4.00 ng/mL  Comprehensive metabolic panel with GFR   Collection Time: 01/04/24  7:44 AM  Result Value Ref Range   Sodium 140 135 - 145 mEq/L   Potassium 4.3 3.5 - 5.1 mEq/L   Chloride 104  96 - 112 mEq/L   CO2 28 19 - 32 mEq/L   Glucose, Bld 143 (H) 70 - 99 mg/dL   BUN 17 6 - 23 mg/dL   Creatinine, Ser 8.99 0.40 - 1.50 mg/dL   Total Bilirubin 1.0 0.2 - 1.2 mg/dL   Alkaline Phosphatase 75 39 - 117 U/L   AST 18 0 - 37 U/L   ALT 16 0 - 53 U/L  Total Protein 6.4 6.0 - 8.3 g/dL   Albumin 4.3 3.5 - 5.2 g/dL   GFR 19.51 >39.99 mL/min   Calcium 9.2 8.4 - 10.5 mg/dL  Lipid panel   Collection Time: 01/04/24  7:44 AM  Result Value Ref Range   Cholesterol 186 0 - 200 mg/dL   Triglycerides 15.9 0.0 - 149.0 mg/dL   HDL 47.69 >60.99 mg/dL   VLDL 83.1 0.0 - 59.9 mg/dL   LDL Cholesterol 882 (H) 0 - 99 mg/dL   Total CHOL/HDL Ratio 4    NonHDL 133.75     Assessment & Plan:   Problem List Items Addressed This Visit     Encounter for general adult medical examination with abnormal findings - Primary (Chronic)   Preventative protocols reviewed and updated unless pt declined. Discussed healthy diet and lifestyle.  Order cologuard.  He was not fasting with latest labs - done after breakfast.      ED (erectile dysfunction)   Trouble maintaining erection.  Reviewed treatment options.  Desires to retry Viagra . He can start with 50 mg dose, and increase to 100 mg if necessary. The method of use 1 hour prior to anticipated intercourse is explained. He should not use any more than one tablet in a 24 hour period. The side effects of possible headache, flushing, dyspepsia and transient changes in vision have been explained. Advised to seek urgent medical care if he develops priapism. The patient is not taking nitrates. I have counseled him that taking Viagra  with nitrates of any form can cause life threatening drops in blood pressure.  Await cardiac CT.        Primary hypertension   Chronic, stable. Continue amlodipine  5mg  daily.       Relevant Medications   amLODipine  (NORVASC ) 5 MG tablet   sildenafil  (VIAGRA ) 100 MG tablet   Hyperlipidemia   Chronic, mild.  Actually improved  cholesterol control noted over the past year, without significant diet changes.  Reviewed ASCVD risk with patient.  Discussed cardiac CT - he is interested so will order.  The 10-year ASCVD risk score (Arnett DK, et al., 2019) is: 9.9%   Values used to calculate the score:     Age: 4 years     Clincally relevant sex: Male     Is Non-Hispanic African American: No     Diabetic: No     Tobacco smoker: No     Systolic Blood Pressure: 122 mmHg     Is BP treated: Yes     HDL Cholesterol: 52.3 mg/dL     Total Cholesterol: 186 mg/dL       Relevant Medications   amLODipine  (NORVASC ) 5 MG tablet   sildenafil  (VIAGRA ) 100 MG tablet   Other Relevant Orders   CT CARDIAC SCORING (SELF PAY ONLY)   Filiform wart   IC obtained an in chart. Liquid nitrogen was applied for 10 seconds to the skin lesion x2 and the expected blistering or scabbing reaction explained. Do not pick at the area. Patient reminded to expect hypopigmented scars from the procedure. Return if lesion fails to fully resolve. Discussed OTC treatment with compound W + duct tape as an option.       Other Visit Diagnoses       Special screening for malignant neoplasms, colon       Relevant Orders   Cologuard     Need for vaccination against Streptococcus pneumoniae       Relevant Orders   Pneumococcal conjugate vaccine 20-valent (  Prevnar 20) (Completed)     Need for Tdap vaccination            Meds ordered this encounter  Medications   amLODipine  (NORVASC ) 5 MG tablet    Sig: Take 1 tablet (5 mg total) by mouth daily.    Dispense:  90 tablet    Refill:  3   sildenafil  (VIAGRA ) 100 MG tablet    Sig: Take 0.5-1 tablets (50-100 mg total) by mouth daily as needed for erectile dysfunction.    Dispense:  10 tablet    Refill:  6    Orders Placed This Encounter  Procedures   CT CARDIAC SCORING (SELF PAY ONLY)    Standing Status:   Future    Expiration Date:   01/10/2025    Preferred imaging location?:   [facility]  [place]   Pneumococcal conjugate vaccine 20-valent (Prevnar 20)   Tdap vaccine greater than or equal to 7yo IM   Cologuard    Patient Instructions  Tetanus and whooping cough shot , as well as pneumonia shots today Lesion to skin of left arm treated with liquid nitrogen today. May try salicylic acid (compound W) as needed over the counter for wart treatment. Return for re-treatment if needed.  I will order heart CT to be done at [facility] outpatient imaging center.  May price out viagra  50-100mg  as needed for relations. Return as needed or in 1 year for next physical   Follow up plan: Return in about 1 year (around 01/10/2025) for annual exam, prior fasting for blood work.  Anton Blas, MD

## 2024-01-11 NOTE — Assessment & Plan Note (Signed)
 IC obtained an in chart. Liquid nitrogen was applied for 10 seconds to the skin lesion x2 and the expected blistering or scabbing reaction explained. Do not pick at the area. Patient reminded to expect hypopigmented scars from the procedure. Return if lesion fails to fully resolve. Discussed OTC treatment with compound W + duct tape as an option.

## 2024-01-11 NOTE — Assessment & Plan Note (Signed)
 Trouble maintaining erection.  Reviewed treatment options.  Desires to retry Viagra . He can start with 50 mg dose, and increase to 100 mg if necessary. The method of use 1 hour prior to anticipated intercourse is explained. He should not use any more than one tablet in a 24 hour period. The side effects of possible headache, flushing, dyspepsia and transient changes in vision have been explained. Advised to seek urgent medical care if he develops priapism. The patient is not taking nitrates. I have counseled him that taking Viagra  with nitrates of any form can cause life threatening drops in blood pressure.  Await cardiac CT.

## 2024-02-04 DIAGNOSIS — Z1211 Encounter for screening for malignant neoplasm of colon: Secondary | ICD-10-CM | POA: Diagnosis not present

## 2024-02-12 LAB — COLOGUARD: COLOGUARD: POSITIVE — AB

## 2024-02-15 ENCOUNTER — Ambulatory Visit: Payer: Self-pay | Admitting: Family Medicine

## 2024-02-15 DIAGNOSIS — R195 Other fecal abnormalities: Secondary | ICD-10-CM

## 2024-05-12 ENCOUNTER — Ambulatory Visit: Payer: Self-pay

## 2024-05-12 NOTE — Telephone Encounter (Signed)
 Next Appt With Family Medicine Dessie Balls, MD) 05/13/2024 at 12:00 PM

## 2024-05-12 NOTE — Telephone Encounter (Signed)
 Will see patient then Agree with ER and UC precautions

## 2024-05-12 NOTE — Telephone Encounter (Signed)
 FYI Only or Action Required?: FYI only for provider: appointment scheduled on 12/12.  Patient was last seen in primary care on 01/11/2024 by Rilla Baller, MD.  Called Nurse Triage reporting Hypertension.  Symptoms began several weeks ago.  Interventions attempted: Rest, hydration, or home remedies.  Symptoms are: fluctuates.  Triage Disposition: See Physician Within 24 Hours, See PCP When Office is Open (Within 3 Days)  Patient/caregiver understands and will follow disposition?: Yes  Copied from CRM #[id number]. Topic: Clinical - Red Word Triage >> May 12, 2024  1:45 PM Rea ORN wrote: Red Word that prompted transfer to Nurse Triage: high BP, 150/100, dizzy Reason for Disposition  [1] MILD dizziness (e.g., walking normally) AND [2] has NOT been evaluated by doctor (or NP/PA) for this  (Exception: Dizziness caused by heat exposure, sudden standing, or poor fluid intake.)  Systolic BP >= 180 OR Diastolic >= 110  Answer Assessment - Initial Assessment Questions 127/90 this morning- in the evening it is elevated- last night 150/100-  Endorses light headed/faint dizziness that is very mild. Able to still work, walk normal, and has no coordination issues. Patient denies unilateral weakness, headache, N/V, speech or acute vision changes. Eyes have gotten worse over the last few months but nothing new.  Denies missed doses of Amlodipine . Endorses hydration and consistent meals.  Started on Amlodipine  in October of 2024- upon initiation of med- had some similar light headed sensation for a short time then resolved on its own.   Appt with PCP office 12/12 to discuss. ED/UC precautions understood.   1. BLOOD PRESSURE: What is your blood pressure? Did you take at least two measurements 5 minutes apart?     BP this morning 150/100 2. ONSET: When did you take your blood pressure?     Last 3 weeks  3. HOW: How did you take your blood pressure? (e.g., automatic home BP monitor, visiting  nurse)     Automatic arm cuff  4. HISTORY: Do you have a history of high blood pressure?     Taking Amlodipine   5. MEDICINES: Are you taking any medicines for blood pressure? Have you missed any doses recently?     No missed doses of meds  6. OTHER SYMPTOMS: Do you have any symptoms? (e.g., blurred vision, chest pain, difficulty breathing, headache, weakness)     Dizziness (slight light headed)  Answer Assessment - Initial Assessment Questions 1. DESCRIPTION: Describe your dizziness.     Light headed 2. LIGHTHEADED: Do you feel lightheaded? (e.g., somewhat faint, woozy, weak upon standing)     Somewhat faint/light headed.  3. VERTIGO: Do you feel like either you or the room is spinning or tilting? (i.e., vertigo)     Denies  4. SEVERITY: How bad is it?  Do you feel like you are going to faint? Can you stand and walk?     Mild- still able to work- denies feeling imbalance 5. ONSET:  When did the dizziness begin?     About 3 weeks ago  6. AGGRAVATING FACTORS: Does anything make it worse? (e.g., standing, change in head position)     denies 7. HEART RATE: Can you tell me your heart rate? How many beats in 15 seconds?  (Note: Not all patients can do this.)       Ranges 70-90- consistent with normal  8. CAUSE: What do you think is causing the dizziness? (e.g., decreased fluids or food, diarrhea, emotional distress, heat exposure, new medicine, sudden standing, vomiting; unknown)  HTN 9. RECURRENT SYMPTOM: Have you had dizziness before? If Yes, ask: When was the last time? What happened that time?     Had some mild dizziness when initiated amlodipine   10. OTHER SYMPTOMS: Do you have any other symptoms? (e.g., fever, chest pain, vomiting, diarrhea, bleeding)       Elevated BP  Protocols used: Blood Pressure - High-A-AH, Dizziness - Lightheadedness-A-AH

## 2024-05-13 ENCOUNTER — Ambulatory Visit: Admitting: Family Medicine

## 2024-05-13 ENCOUNTER — Encounter: Payer: Self-pay | Admitting: Family Medicine

## 2024-05-13 VITALS — BP 143/85 | HR 65 | Temp 98.5°F | Ht 70.0 in | Wt 219.4 lb

## 2024-05-13 DIAGNOSIS — I10 Essential (primary) hypertension: Secondary | ICD-10-CM | POA: Diagnosis not present

## 2024-05-13 DIAGNOSIS — R42 Dizziness and giddiness: Secondary | ICD-10-CM | POA: Diagnosis not present

## 2024-05-13 MED ORDER — CHLORTHALIDONE 25 MG PO TABS: 12.5000 mg | ORAL_TABLET | Freq: Every day | ORAL | 0 refills | Status: AC

## 2024-05-13 NOTE — Assessment & Plan Note (Signed)
 Intermittent/ not positional  Pt thinks due to elevated blood pressure based on past history Reviewed past pcp notes and labs  No other neuro symptoms  Reassuring exam   Will work on blood pressure /see a/p for HTN  Update if not starting to improve in a week or if worsening  Call back and Er precautions noted in detail today   Follow up pcp 1-2 wk

## 2024-05-13 NOTE — Progress Notes (Signed)
 Subjective:    Patient ID: Christopher Ballard, male    DOB: December 09, 1960, 63 y.o.   MRN: [id number]  HPI  Wt Readings from Last 3 Encounters:  05/13/24 219 lb 6 oz (99.5 kg)  01/11/24 216 lb 6 oz (98.1 kg)  10/29/23 214 lb 8 oz (97.3 kg)   31.48 kg/m  Vitals:   05/13/24 1144 05/13/24 1209  BP: (!) 146/90 (!) 143/85  Pulse: 65   Temp: 98.5 F (36.9 C)   SpO2: 97%     63 yo pt of Dr KANDICE presents for  Dizziness Blood pressure concerns    Pmhx notable for primary HTN   For last mo blood pressure is higher than usual  150/100 often  Usually no symptoms  Now feels like his head is hot  Sometimes a bit light headed   Has gained some weight gradually  Too much processed food / eats fast food and has to eat out at work   Exercise - active job  No extra exercise   Alcohol - some , not a lot  6 beers a week    Smoking  Non smoker  Some dipping tob  Not around smoke   Stress is not high   Good water intake At least 48  oz daily     HTN  No cp or palpitations or headaches or edema  No side effects to medicines  BP Readings from Last 3 Encounters:  05/13/24 (!) 143/85  01/11/24 122/88  10/29/23 134/88   Takes amlodipine  5 mg daily and is generally well controlled     Lab Results  Component Value Date   NA 140 01/04/2024   K 4.3 01/04/2024   CO2 28 01/04/2024   GLUCOSE 143 (H) 01/04/2024   BUN 17 01/04/2024   CREATININE 1.00 01/04/2024   CALCIUM 9.2 01/04/2024   GFR 80.48 01/04/2024   GFRNONAA >60 07/05/2017   Dizziness Not spinning  Just a little light headed - comes and goes Not positional  Not enough to change what he is doing   No neuro symptoms    Lab Results  Component Value Date   ALT 16 01/04/2024   AST 18 01/04/2024   ALKPHOS 75 01/04/2024   BILITOT 1.0 01/04/2024   Lab Results  Component Value Date   WBC 5.5 03/03/2022   HGB 14.8 03/03/2022   HCT 41.8 03/03/2022   MCV 92.8 03/03/2022   PLT 179.0 03/03/2022   Lab Results   Component Value Date   TSH 1.32 03/03/2022   Lab Results  Component Value Date   CHOL 186 01/04/2024   HDL 52.30 01/04/2024   LDLCALC 117 (H) 01/04/2024   LDLDIRECT 136.5 05/12/2008   TRIG 84.0 01/04/2024   CHOLHDL 4 01/04/2024     Patient Active Problem List   Diagnosis Date Noted   Hyperlipidemia 01/11/2024   Filiform wart 01/11/2024   Primary hypertension 03/03/2022   Left atrial enlargement 03/03/2022   Encounter for general adult medical examination with abnormal findings 05/19/2012   ED (erectile dysfunction) 05/19/2012   Light headedness 02/18/2011   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 10/2014   Bicipital tendinitis of left shoulder 10/2014   COVID-19 virus infection 02/14/2020   Dental crowns present    GERD (gastroesophageal reflux disease)    Impingement syndrome of left shoulder region 10/2014   Past Surgical History:  Procedure Laterality Date   BICEPT TENODESIS Left 10/19/2014  Procedure: BICEPS TENODESIS;  Surgeon: Toribio Chancy, MD;  Location: [facility];  Service: Orthopedics;  Laterality: Left;   CARDIOVASCULAR STRESS TEST  07/2017   low risk study (Dr End)   COLONOSCOPY WITH PROPOFOL   07/23/2012   WNL Ollen)   WISDOM TOOTH EXTRACTION  1987   Social History[1] Family History  Problem Relation Age of Onset   Seizures Father    Heart disease Neg Hx    Stroke Neg Hx    Diabetes Neg Hx    Cancer Neg Hx    Allergies[2] Medications Ordered Prior to Encounter[3]  Review of Systems  Constitutional:  Negative for activity change, appetite change, fatigue, fever and unexpected weight change.  HENT:  Negative for congestion, rhinorrhea, sore throat and trouble swallowing.   Eyes:  Negative for pain, redness, itching and visual disturbance.  Respiratory:  Negative for cough, chest tightness, shortness of breath and wheezing.   Cardiovascular:  Negative for chest pain,  palpitations and leg swelling.  Gastrointestinal:  Negative for abdominal pain, blood in stool, constipation, diarrhea and nausea.  Endocrine: Negative for cold intolerance, heat intolerance, polydipsia and polyuria.       Occational feels hot around face  Genitourinary:  Negative for difficulty urinating, dysuria, frequency and urgency.  Musculoskeletal:  Negative for arthralgias, joint swelling and myalgias.  Skin:  Negative for pallor and rash.  Neurological:  Positive for light-headedness. Negative for dizziness, tremors, seizures, syncope, facial asymmetry, speech difficulty, weakness, numbness and headaches.  Hematological:  Negative for adenopathy. Does not bruise/bleed easily.  Psychiatric/Behavioral:  Negative for decreased concentration and dysphoric mood. The patient is not nervous/anxious.        Objective:   Physical Exam Constitutional:      General: He is not in acute distress.    Appearance: Normal appearance. He is well-developed. He is obese. He is not ill-appearing or diaphoretic.  HENT:     Head: Normocephalic and atraumatic.     Right Ear: External ear normal.     Left Ear: External ear normal.     Nose: Nose normal.     Mouth/Throat:     Pharynx: No oropharyngeal exudate.  Eyes:     General: No scleral icterus.       Right eye: No discharge.        Left eye: No discharge.     Conjunctiva/sclera: Conjunctivae normal.     Pupils: Pupils are equal, round, and reactive to light.     Comments: No nystagmus  Neck:     Thyroid : No thyromegaly.     Vascular: No carotid bruit or JVD.     Trachea: No tracheal deviation.  Cardiovascular:     Rate and Rhythm: Normal rate and regular rhythm.     Heart sounds: Normal heart sounds. No murmur heard. Pulmonary:     Effort: Pulmonary effort is normal. No respiratory distress.     Breath sounds: Normal breath sounds. No wheezing or rales.  Abdominal:     General: Bowel sounds are normal. There is no distension.      Palpations: Abdomen is soft. There is no mass.     Tenderness: There is no abdominal tenderness.  Musculoskeletal:        General: No tenderness.     Cervical back: Full passive range of motion without pain, normal range of motion and neck supple.  Lymphadenopathy:     Cervical: No cervical adenopathy.  Skin:    General: Skin is warm and dry.  Coloration: Skin is not jaundiced or pale.     Findings: No bruising or rash.  Neurological:     Mental Status: He is alert and oriented to person, place, and time.     Cranial Nerves: No cranial nerve deficit, dysarthria or facial asymmetry.     Sensory: No sensory deficit.     Motor: No weakness, tremor, atrophy, abnormal muscle tone, seizure activity or pronator drift.     Coordination: Romberg sign negative. Coordination normal. Finger-Nose-Finger Test normal.     Gait: Gait normal.     Deep Tendon Reflexes: Reflexes are normal and symmetric. Reflexes normal.     Comments: No focal cerebellar signs   Psychiatric:        Behavior: Behavior normal.        Thought Content: Thought content normal.           Assessment & Plan:   Problem List Items Addressed This Visit       Cardiovascular and Mediastinum   Primary hypertension - Primary   Worse recently  Unsure if elevted blood pressure is causing light headed feeling  Gradual increase in blood pressure over past several mo  BP: (!) 143/85  bp in fair control at this time  BP Readings from Last 1 Encounters:  05/13/24 (!) 143/85    Most recent labs reviewed  Disc lifstyle change with low sodium diet and exercise  Given handouts on DASH eating and Mediterranean diet and how to take blood pressure   Continue amlodipine  5 mg daily  Add chlorthalidone 12.5 mg daily (instructed to call if side effects)  Will monitor at home Follow up pcp in 1-2 wk for visit and labs   Call back and Er precautions noted in detail today    I personally spent a total of 31 minutes in the care  of the patient today including preparing to see the patient, getting/reviewing separately obtained history, performing a medically appropriate exam/evaluation, counseling and educating, placing orders, and documenting clinical information in the EHR.        Relevant Medications   chlorthalidone (HYGROTON) 25 MG tablet     Other   Light headedness   Intermittent/ not positional  Pt thinks due to elevated blood pressure based on past history Reviewed past pcp notes and labs  No other neuro symptoms  Reassuring exam   Will work on blood pressure /see a/p for HTN  Update if not starting to improve in a week or if worsening  Call back and Er precautions noted in detail today   Follow up pcp 1-2 wk           [1]  Social History Tobacco Use   Smoking status: Never   Smokeless tobacco: Current    Types: Chew  Substance Use Topics   Alcohol use: Yes    Alcohol/week: 5.0 standard drinks of alcohol    Types: 5 Cans of beer per week   Drug use: No  [2] No Known Allergies [3]  Current Outpatient Medications on File Prior to Visit  Medication Sig Dispense Refill   amLODipine  (NORVASC ) 5 MG tablet Take 1 tablet (5 mg total) by mouth daily. 90 tablet 3   sildenafil  (VIAGRA ) 100 MG tablet Take 0.5-1 tablets (50-100 mg total) by mouth daily as needed for erectile dysfunction. 10 tablet 6   No current facility-administered medications on file prior to visit.

## 2024-05-13 NOTE — Assessment & Plan Note (Addendum)
 Worse recently  Unsure if elevted blood pressure is causing light headed feeling  Gradual increase in blood pressure over past several mo  BP: (!) 143/85  bp in fair control at this time  BP Readings from Last 1 Encounters:  05/13/24 (!) 143/85    Most recent labs reviewed  Disc lifstyle change with low sodium diet and exercise  Given handouts on DASH eating and Mediterranean diet and how to take blood pressure   Continue amlodipine  5 mg daily  Add chlorthalidone 12.5 mg daily (instructed to call if side effects)  Will monitor at home Follow up pcp in 1-2 wk for visit and labs   Call back and Er precautions noted in detail today    I personally spent a total of 31 minutes in the care of the patient today including preparing to see the patient, getting/reviewing separately obtained history, performing a medically appropriate exam/evaluation, counseling and educating, placing orders, and documenting clinical information in the EHR.

## 2024-05-13 NOTE — Patient Instructions (Signed)
 Continue the amldopine 5 mg daily   Add chlorthalidone 12.5 mg daily (1/2 pill)  Take it in the am   If any side effects -stop it and let us  know   Stay hydrated / keep your water intake up     Work on healthy diet  Avoid added sugars in your diet when you can  Try to get most of your carbohydrates from produce (with the exception of white potatoes) and whole grains Eat less bread/pasta/rice/snack foods/cereals/sweets and other items from the middle of the grocery store (processed carbs)  Any exercise is helpful    Follow up with Dr KANDICE in 1-2 weeks for visit and labs

## 2024-05-13 NOTE — Telephone Encounter (Signed)
 Appreciate Dr Randeen seeing pt today.

## 2024-06-13 ENCOUNTER — Encounter: Payer: Self-pay | Admitting: Internal Medicine

## 2024-06-24 ENCOUNTER — Other Ambulatory Visit: Payer: Self-pay | Admitting: Family Medicine

## 2024-06-24 DIAGNOSIS — I10 Essential (primary) hypertension: Secondary | ICD-10-CM

## 2024-06-27 ENCOUNTER — Ambulatory Visit: Admitting: Family Medicine

## 2024-07-04 ENCOUNTER — Ambulatory Visit: Admitting: Family Medicine

## 2024-07-07 ENCOUNTER — Other Ambulatory Visit: Payer: Self-pay | Admitting: Family Medicine

## 2024-07-12 ENCOUNTER — Ambulatory Visit: Admitting: Family Medicine

## 2025-01-04 ENCOUNTER — Other Ambulatory Visit

## 2025-01-11 ENCOUNTER — Encounter: Admitting: Family Medicine
